# Patient Record
Sex: Male | Born: 1940 | Race: White | Hispanic: No | Smoking: Never smoker
Health system: Southern US, Community
[De-identification: ages and names within clinical notes are randomized; demographics above are authoritative.]

## PROBLEM LIST (undated history)

## (undated) DIAGNOSIS — I5020 Unspecified systolic (congestive) heart failure: Secondary | ICD-10-CM

## (undated) DIAGNOSIS — J449 Chronic obstructive pulmonary disease, unspecified: Secondary | ICD-10-CM

## (undated) DIAGNOSIS — D649 Anemia, unspecified: Secondary | ICD-10-CM

## (undated) DIAGNOSIS — N184 Chronic kidney disease, stage 4 (severe): Secondary | ICD-10-CM

## (undated) DIAGNOSIS — I10 Essential (primary) hypertension: Secondary | ICD-10-CM

## (undated) DIAGNOSIS — G20 Parkinson's disease: Secondary | ICD-10-CM

## (undated) DIAGNOSIS — G20A1 Parkinson's disease without dyskinesia, without mention of fluctuations: Secondary | ICD-10-CM

## (undated) DIAGNOSIS — I480 Paroxysmal atrial fibrillation: Secondary | ICD-10-CM

---

## 2017-04-24 ENCOUNTER — Encounter: Payer: Self-pay | Admitting: Emergency Medicine

## 2017-04-24 ENCOUNTER — Emergency Department
Admission: EM | Admit: 2017-04-24 | Discharge: 2017-04-24 | Disposition: A | Discharge status: Home / Self Care | Payer: Medicare Other | Attending: Emergency Medicine | Admitting: Emergency Medicine

## 2017-04-24 DIAGNOSIS — I10 Essential (primary) hypertension: Secondary | ICD-10-CM | POA: Insufficient documentation

## 2017-04-24 DIAGNOSIS — G20 Parkinson's disease: Secondary | ICD-10-CM | POA: Diagnosis not present

## 2017-04-24 DIAGNOSIS — Y999 Unspecified external cause status: Secondary | ICD-10-CM | POA: Insufficient documentation

## 2017-04-24 DIAGNOSIS — L089 Local infection of the skin and subcutaneous tissue, unspecified: Secondary | ICD-10-CM | POA: Diagnosis not present

## 2017-04-24 DIAGNOSIS — T148XXA Other injury of unspecified body region, initial encounter: Secondary | ICD-10-CM | POA: Insufficient documentation

## 2017-04-24 DIAGNOSIS — R2681 Unsteadiness on feet: Secondary | ICD-10-CM | POA: Diagnosis not present

## 2017-04-24 DIAGNOSIS — Y929 Unspecified place or not applicable: Secondary | ICD-10-CM | POA: Diagnosis not present

## 2017-04-24 DIAGNOSIS — W19XXXA Unspecified fall, initial encounter: Secondary | ICD-10-CM | POA: Insufficient documentation

## 2017-04-24 DIAGNOSIS — Y939 Activity, unspecified: Secondary | ICD-10-CM | POA: Insufficient documentation

## 2017-04-24 DIAGNOSIS — J449 Chronic obstructive pulmonary disease, unspecified: Secondary | ICD-10-CM | POA: Insufficient documentation

## 2017-04-24 DIAGNOSIS — Y92009 Unspecified place in unspecified non-institutional (private) residence as the place of occurrence of the external cause: Secondary | ICD-10-CM

## 2017-04-24 HISTORY — DX: Essential (primary) hypertension: I10

## 2017-04-24 HISTORY — DX: Chronic obstructive pulmonary disease, unspecified: J44.9

## 2017-04-24 HISTORY — DX: Parkinson's disease: G20

## 2017-04-24 HISTORY — DX: Parkinson's disease without dyskinesia, without mention of fluctuations: G20.A1

## 2017-04-24 MED ORDER — SULFAMETHOXAZOLE-TRIMETHOPRIM 800-160 MG PO TABS
1.0000 | ORAL_TABLET | Freq: Once | ORAL | Status: AC
  Administered 2017-04-24: 1 via ORAL
  Filled 2017-04-24: qty 1

## 2017-04-24 MED ORDER — SULFAMETHOXAZOLE-TRIMETHOPRIM 800-160 MG PO TABS: 1.0000 | ORAL_TABLET | Freq: Two times a day (BID) | ORAL | 0 refills | Status: DC

## 2017-04-24 MED ORDER — MUPIROCIN CALCIUM 2 % EX CREA
TOPICAL_CREAM | Freq: Once | CUTANEOUS | Status: AC
  Administered 2017-04-24: 18:00:00 via TOPICAL
  Filled 2017-04-24: qty 15

## 2017-04-24 NOTE — ED Triage Notes (Addendum)
FIRST NURSE NOTE-fell 4 days ago and hurt left arm.  Concerned may be infected. Scant purulent drainage noted.

## 2017-04-24 NOTE — ED Provider Notes (Signed)
[facility] Emergency Department Provider Note  ____________________________________________  Time seen: Approximately 5:35 PM  I have reviewed the triage vital signs and the nursing notes.   HISTORY  Chief Complaint Fall (4 days ago)    HPI Alex Daniel is a 76 y.o. male Who presents emergency department complaining of possible infected wound to the right forearm. Patient reports he has Parkinson's and is sometimes unsteady on his feet. Patient suffered a fall approximately 4 days ago and suffered a skin tear to the right forearm. Patient reports that over the last 2 daysthe wound has become erythematous, draining mild purulent material. Patient denies any increase in pain. Patient is here to ensure that wound is not infected. No other complaints at this time.   Past Medical History:  Diagnosis Date  . COPD (chronic obstructive pulmonary disease) ([place])   . Hypertension   . Parkinson disease ([place])   . Renal disorder     There are no active problems to display for this patient.   History reviewed. No pertinent surgical history.  Prior to Admission medications   Medication Sig Start Date End Date Taking? Authorizing Provider  sulfamethoxazole-trimethoprim (BACTRIM DS,SEPTRA DS) 800-160 MG tablet Take 1 tablet by mouth 2 (two) times daily. 04/24/17   Cuthriell, Charline Bills, PA-C    Allergies Patient has no known allergies.  History reviewed. No pertinent family history.  Social History Social History  Substance Use Topics  . Smoking status: Never Smoker  . Smokeless tobacco: Never Used  . Alcohol use Not on file     Review of Systems  Constitutional: No fever/chills Cardiovascular: no chest pain. Respiratory: no cough. No SOB. Gastrointestinal: No abdominal pain.  No nausea, no vomiting.   Musculoskeletal: Negative for musculoskeletal pain. Skin: positive for skin tear with possible infection Neurological: Negative for headaches, focal  weakness or numbness. 10-point ROS otherwise negative.  ____________________________________________   PHYSICAL EXAM:  VITAL SIGNS: ED Triage Vitals [04/24/17 1636]  Enc Vitals Group     BP 130/60     Pulse Rate 79     Resp 16     Temp 98.9 F (37.2 C)     Temp Source Oral     SpO2 95 %     Weight 270 lb (122.5 kg)     Height 5\' 11"  (1.803 m)     Head Circumference      Peak Flow      Pain Score      Pain Loc      Pain Edu?      Excl. in [place]?      Constitutional: Alert and oriented. Well appearing and in no acute distress. Eyes: Conjunctivae are normal. PERRL. EOMI. Head: Atraumatic. Neck: No stridor.    Cardiovascular: Normal rate, regular rhythm. Normal S1 and S2.  Good peripheral circulation. Respiratory: Normal respiratory effort without tachypnea or retractions. Lungs CTAB. Good air entry to the bases with no decreased or absent breath sounds. Musculoskeletal: Full range of motion to all extremities. No gross deformities appreciated. Neurologic:  Normal speech and language. No gross focal neurologic deficits are appreciated.  Skin:  Skin is warm, dry and intact. No rash noted.kin tears noted to right forearm. Area has mild surrounding erythema. Minimal purulent drainage is noted from skin tear. No underlying fluctuance or induration. Radial pulse intact affected extremity. Sensation intact all 5 digits right extremity. Psychiatric: Mood and affect are normal. Speech and behavior are normal. Patient exhibits appropriate insight and judgement.  ____________________________________________   LABS (all labs ordered are listed, but only abnormal results are displayed)  Labs Reviewed - No data to display ____________________________________________  EKG   ____________________________________________  RADIOLOGY   No results found.  ____________________________________________    PROCEDURES  Procedure(s) performed:    Procedures  The wound is  cleansed, bactroban topical antibiotic applied, and dressed. The patient is alerted to watch for any other signs of worsening infection (redness, pus, pain, increased swelling or fever) and call if such occurs. Home wound care instructions are provided. Tetanus vaccination status reviewed: tetanus re-vaccination not indicated.   Medications  sulfamethoxazole-trimethoprim (BACTRIM DS,SEPTRA DS) 800-160 MG per tablet 1 tablet (not administered)  mupirocin cream (BACTROBAN) 2 % (not administered)     ____________________________________________   INITIAL IMPRESSION / ASSESSMENT AND PLAN / ED COURSE  Pertinent labs & imaging results that were available during my care of the patient were reviewed by me and considered in my medical decision making (see chart for details).  Review of the [place] CSRS was performed in accordance of the [place] prior to dispensing any controlled drugs.     Patient's diagnosis is consistent with infected skin tear. Patient has skin tear to the right forearm from a fall 4 days prior. Area shows moderate signs of infection. This is cleansed, dressed in the emergency department.. Patient will be discharged home with prescriptions for Bactrim. Patient is to follow up with primary care as needed or otherwise directed. Patient is given ED precautions to return to the ED for any worsening or new symptoms.     ____________________________________________  FINAL CLINICAL IMPRESSION(S) / ED DIAGNOSES  Final diagnoses:  Fall in home, initial encounter  Infected skin tear      NEW MEDICATIONS STARTED DURING THIS VISIT:  New Prescriptions   SULFAMETHOXAZOLE-TRIMETHOPRIM (BACTRIM DS,SEPTRA DS) 800-160 MG TABLET    Take 1 tablet by mouth 2 (two) times daily.        This chart was dictated using voice recognition software/Dragon. Despite best efforts to proofread, errors can occur which can change the meaning. Any change was purely unintentional.    Darletta Moll, PA-C 04/24/17 1747    Earleen Newport, MD 04/24/17 [phone]

## 2017-10-26 ENCOUNTER — Emergency Department
Admission: EM | Admit: 2017-10-26 | Discharge: 2017-10-27 | Disposition: A | Discharge status: Home / Self Care | Payer: Medicare Other | Attending: Emergency Medicine | Admitting: Emergency Medicine

## 2017-10-26 ENCOUNTER — Other Ambulatory Visit: Payer: Self-pay

## 2017-10-26 ENCOUNTER — Emergency Department: Payer: Medicare Other

## 2017-10-26 DIAGNOSIS — G20 Parkinson's disease: Secondary | ICD-10-CM | POA: Insufficient documentation

## 2017-10-26 DIAGNOSIS — I10 Essential (primary) hypertension: Secondary | ICD-10-CM | POA: Diagnosis not present

## 2017-10-26 DIAGNOSIS — J441 Chronic obstructive pulmonary disease with (acute) exacerbation: Secondary | ICD-10-CM | POA: Diagnosis not present

## 2017-10-26 DIAGNOSIS — R0602 Shortness of breath: Secondary | ICD-10-CM | POA: Diagnosis present

## 2017-10-26 MED ORDER — ALBUTEROL SULFATE HFA 108 (90 BASE) MCG/ACT IN AERS: INHALATION_SPRAY | RESPIRATORY_TRACT | 1 refills | Status: AC

## 2017-10-26 MED ORDER — DEXAMETHASONE 6 MG PO TABS: 10.0000 mg | ORAL_TABLET | ORAL | Status: DC

## 2017-10-26 MED ORDER — ALBUTEROL SULFATE (2.5 MG/3ML) 0.083% IN NEBU
5.0000 mg | INHALATION_SOLUTION | Freq: Once | RESPIRATORY_TRACT | Status: AC
  Administered 2017-10-26: 5 mg via RESPIRATORY_TRACT
  Filled 2017-10-26: qty 6

## 2017-10-26 MED ORDER — DEXAMETHASONE 10 MG/ML FOR PEDIATRIC ORAL USE
10.0000 mg | Freq: Once | INTRAMUSCULAR | Status: AC
  Administered 2017-10-26: 10 mg via ORAL

## 2017-10-26 MED ORDER — DEXAMETHASONE SODIUM PHOSPHATE 10 MG/ML IJ SOLN
INTRAMUSCULAR | Status: AC
  Administered 2017-10-26: 10 mg via ORAL
  Filled 2017-10-26: qty 1

## 2017-10-26 NOTE — ED Notes (Signed)
Pt reports he has a Hx of COPD and in the last week it has gotten worse. Pt is alert and oriented x 4. Family at bedside.

## 2017-10-26 NOTE — ED Provider Notes (Signed)
[facility] Emergency Department Provider Note  ____________________________________________   First MD Initiated Contact with Patient 10/26/17 2314     (approximate)  I have reviewed the triage vital signs and the nursing notes.   HISTORY  Chief Complaint Shortness of Breath    HPI Alex Daniel is a 77 y.o. male with medical history as listed below which notably includes mild COPD with no oxygen requirement.  Of note, he also disputes the Parkinson disease diagnosis and states that he has been to 2 neurologists including one recently at [place] who states that he does NOT have Parkinson's, and he is currently working with a provider to wean him off of all his "unnecessary" medications.  He presents today for evaluation of gradually worsening shortness of breath over the last week.  He says that he is affected by the pollen that has been going on so heavily recently and has tried to avoid going outside.  He reports that his shortness of breath is worse with exertion and better with rest.  He has had a mild and occasionally productive cough.  He denies fever/chills, chest pain, nausea, vomiting, and abdominal pain.  He does have inhalers at home and has been using them but only uses 1 or 2 puffs a time and it does not seem to be helping.  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  He does not have a pulmonologist.  He received a DuoNeb upon arrival in the emergency department and states that he feels much better and is ready to go home.  Past Medical History:  Diagnosis Date  . COPD (chronic obstructive pulmonary disease) ([place])   . Hypertension   . Parkinson disease ([place])   . Renal disorder     There are no active problems to display for this patient.   History reviewed. No pertinent surgical history.  Prior to Admission medications   Medication Sig Start Date End Date Taking?  Authorizing Provider  albuterol (PROVENTIL HFA;VENTOLIN HFA) 108 (90 Base) MCG/ACT inhaler Inhale 2-4 puffs by mouth every 4 hours as needed for wheezing, cough, and/or shortness of breath 10/26/17   Hinda Kehr, MD  sulfamethoxazole-trimethoprim (BACTRIM DS,SEPTRA DS) 800-160 MG tablet Take 1 tablet by mouth 2 (two) times daily. 04/24/17   Cuthriell, Charline Bills, PA-C    Allergies Patient has no known allergies.  No family history on file.  Social History Social History   Tobacco Use  . Smoking status: Never Smoker  . Smokeless tobacco: Never Used  Substance Use Topics  . Alcohol use: Not on file  . Drug use: Not on file    Review of Systems Constitutional: No fever/chills Eyes: No visual changes. ENT: No sore throat. Cardiovascular: Denies chest pain. Respiratory: Gradually worsening shortness of breath for about a week as described above Gastrointestinal: No abdominal pain.  No nausea, no vomiting.  No diarrhea.  No constipation. Genitourinary: Negative for dysuria. Musculoskeletal: Negative for neck pain.  Negative for back pain. Integumentary: Negative for rash. Neurological: Negative for headaches, focal weakness or numbness.   ____________________________________________   PHYSICAL EXAM:  VITAL SIGNS: ED Triage Vitals  Enc Vitals Group     BP 10/26/17 2235 (!) 172/85     Pulse Rate 10/26/17 2235 85     Resp 10/26/17 2235 18     Temp 10/26/17 2235 98.3 F (36.8 C)     Temp Source 10/26/17 2235 Oral  SpO2 10/26/17 2235 93 %     Weight 10/26/17 2233 127 kg (280 lb)     Height 10/26/17 2233 1.829 m (6')     Head Circumference --      Peak Flow --      Pain Score 10/26/17 2232 5     Pain Loc --      Pain Edu? --      Excl. in [place]? --     Constitutional: Alert and oriented. Well appearing and in no acute distress. Eyes: Conjunctivae are normal.  Head: Atraumatic. Nose: No congestion/rhinnorhea. Mouth/Throat: Mucous membranes are moist. Neck: No  stridor.  No meningeal signs.   Cardiovascular: Normal rate, regular rhythm. Good peripheral circulation. Grossly normal heart sounds. Respiratory: Normal respiratory effort.  No retractions.  Minor expiratory wheezing more notable in the left lower lobe than the right, but still minimal. Gastrointestinal: Soft and nontender. No distention. Musculoskeletal: No lower extremity tenderness nor edema. No gross deformities of extremities. Neurologic:  Normal speech and language. No gross focal neurologic deficits are appreciated.  Skin:  Skin is warm, dry and intact. No rash noted. Psychiatric: Mood and affect are normal. Speech and behavior are normal.  ____________________________________________   LABS (all labs ordered are listed, but only abnormal results are displayed)  Labs Reviewed - No data to display ____________________________________________  EKG  Patient declined EKG ____________________________________________  RADIOLOGY I, Hinda Kehr, personally viewed and evaluated these images (plain radiographs) as part of my medical decision making, as well as reviewing the written report by the radiologist.  ED MD interpretation: No evidence of pneumonia or other acute pulmonary process  Official radiology report(s): Dg Chest 2 View  Result Date: 10/26/2017 CLINICAL DATA:  Worsening short of breath EXAM: CHEST - 2 VIEW COMPARISON:  None. FINDINGS: Normal cardiac silhouette. No effusion, infiltrate or pneumothorax. Chronic bronchitic markings. Low lung volumes. IMPRESSION: No acute cardiopulmonary process. Electronically Signed   By: Suzy Bouchard M.D.   On: 10/26/2017 22:59    ____________________________________________   PROCEDURES  Critical Care performed: No   Procedure(s) performed:   Procedures   ____________________________________________   INITIAL IMPRESSION / ASSESSMENT AND PLAN / ED COURSE  As part of my medical decision making, I reviewed the  following data within the [place] History obtained from family, Nursing notes reviewed and incorporated, Old chart reviewed, Radiograph reviewed  and Notes from prior ED visits    Differential includes, but is not limited to, viral syndrome, bronchitis including COPD exacerbation, pneumonia, reactive airway disease including asthma, CHF including exacerbation with or without pulmonary/interstitial edema, pneumothorax, ACS, thoracic trauma, and pulmonary embolism.  However, the patient has a history of mild COPD as well as some seasonal/pollen allergies, his symptoms have been gradual in onset over the last week, he has no evidence of peripheral edema and no history of cardiac disease or CHF.  He received a breathing treatment prior to me coming in the room and states that it made him feel much better and he is ready to go.  I discussed a "standard workup" which would include lab work but I did acknowledge that lab work is unlikely to be particularly helpful in his case.  He assured me multiple times he has no chest pain.  I explained to him that he could be having shortness of breath due to a heart problem and that I would encourage him to get an EKG at least, but he says he just recently saw his cardiologist and "  had all that done" and he does not want an EKG tonight and just wants to go home.  I suggested that we give him a dose of Decadron here in the emergency department rather than starting him on Prednisone (given his age and recent attempts to decrease medications), and he agrees.  I gave the patient and his son-in-law my usual/customary return precautions and encouraged him to follow up as soon as possible with his regular doctor and to return if he gets worse.  They understand and agree with the plan.     ____________________________________________  FINAL CLINICAL IMPRESSION(S) / ED DIAGNOSES  Final diagnoses:  COPD exacerbation ([place])     MEDICATIONS GIVEN DURING THIS  VISIT:  Medications  albuterol (PROVENTIL) (2.5 MG/3ML) 0.083% nebulizer solution 5 mg (5 mg Nebulization Given 10/26/17 2239)  dexamethasone (DECADRON) 10 MG/ML injection for Pediatric ORAL use 10 mg (10 mg Oral Given 10/26/17 2339)     ED Discharge Orders        Ordered    albuterol (PROVENTIL HFA;VENTOLIN HFA) 108 (90 Base) MCG/ACT inhaler     10/26/17 2347       Note:  This document was prepared using Dragon voice recognition software and may include unintentional dictation errors.    Hinda Kehr, MD 10/26/17 [phone]

## 2017-10-26 NOTE — Discharge Instructions (Signed)
We believe that your symptoms are caused today by an exacerbation of your COPD.  Please take your regular medications that you have at home for your COPD.  Feel free to use your albuterol inhaler as much as 2-4 puffs everyFollow up with your doctor as recommended.  If you develop any new or worsening symptoms, including but not limited to fever, persistent vomiting, worsening shortness of breath, or other symptoms that concern you, please return to the Emergency Department immediately.

## 2017-10-26 NOTE — ED Triage Notes (Signed)
Pt arrives to ED via POV from home with c/o worsening SHOB x1 week. No c/o N/V/D or fever, no c/o CP or abdominal pain. Pt reports he used to work with [employer] and states his respiratory issues are r/t huis previous work history. Pt reports ocasional productive cough (white sputum). Pt is A&O, in NAD; RR even, regular, and unlabored; skin color/temp is WNL.

## 2018-08-27 ENCOUNTER — Inpatient Hospital Stay
Admission: EM | Admit: 2018-08-27 | Discharge: 2018-09-03 | DRG: 291 | Disposition: A | Discharge status: Home / Self Care | Payer: Medicare Other | Attending: Internal Medicine | Admitting: Internal Medicine

## 2018-08-27 ENCOUNTER — Emergency Department: Payer: Medicare Other

## 2018-08-27 ENCOUNTER — Encounter: Payer: Self-pay | Admitting: Emergency Medicine

## 2018-08-27 DIAGNOSIS — J449 Chronic obstructive pulmonary disease, unspecified: Secondary | ICD-10-CM | POA: Diagnosis present

## 2018-08-27 DIAGNOSIS — E871 Hypo-osmolality and hyponatremia: Secondary | ICD-10-CM | POA: Diagnosis present

## 2018-08-27 DIAGNOSIS — E875 Hyperkalemia: Secondary | ICD-10-CM | POA: Diagnosis present

## 2018-08-27 DIAGNOSIS — N189 Chronic kidney disease, unspecified: Secondary | ICD-10-CM | POA: Diagnosis not present

## 2018-08-27 DIAGNOSIS — D631 Anemia in chronic kidney disease: Secondary | ICD-10-CM | POA: Diagnosis present

## 2018-08-27 DIAGNOSIS — I4819 Other persistent atrial fibrillation: Secondary | ICD-10-CM | POA: Diagnosis present

## 2018-08-27 DIAGNOSIS — E878 Other disorders of electrolyte and fluid balance, not elsewhere classified: Secondary | ICD-10-CM | POA: Diagnosis present

## 2018-08-27 DIAGNOSIS — J9611 Chronic respiratory failure with hypoxia: Secondary | ICD-10-CM | POA: Diagnosis present

## 2018-08-27 DIAGNOSIS — Z6841 Body Mass Index (BMI) 40.0 and over, adult: Secondary | ICD-10-CM | POA: Diagnosis not present

## 2018-08-27 DIAGNOSIS — G20 Parkinson's disease: Secondary | ICD-10-CM | POA: Diagnosis present

## 2018-08-27 DIAGNOSIS — R531 Weakness: Secondary | ICD-10-CM

## 2018-08-27 DIAGNOSIS — F329 Major depressive disorder, single episode, unspecified: Secondary | ICD-10-CM | POA: Diagnosis present

## 2018-08-27 DIAGNOSIS — Z9981 Dependence on supplemental oxygen: Secondary | ICD-10-CM | POA: Diagnosis not present

## 2018-08-27 DIAGNOSIS — E861 Hypovolemia: Secondary | ICD-10-CM | POA: Diagnosis present

## 2018-08-27 DIAGNOSIS — D649 Anemia, unspecified: Secondary | ICD-10-CM | POA: Diagnosis not present

## 2018-08-27 DIAGNOSIS — R609 Edema, unspecified: Secondary | ICD-10-CM

## 2018-08-27 DIAGNOSIS — N179 Acute kidney failure, unspecified: Secondary | ICD-10-CM | POA: Diagnosis not present

## 2018-08-27 DIAGNOSIS — I361 Nonrheumatic tricuspid (valve) insufficiency: Secondary | ICD-10-CM | POA: Diagnosis not present

## 2018-08-27 DIAGNOSIS — I5043 Acute on chronic combined systolic (congestive) and diastolic (congestive) heart failure: Secondary | ICD-10-CM | POA: Diagnosis present

## 2018-08-27 DIAGNOSIS — I5023 Acute on chronic systolic (congestive) heart failure: Secondary | ICD-10-CM | POA: Diagnosis not present

## 2018-08-27 DIAGNOSIS — R0602 Shortness of breath: Secondary | ICD-10-CM | POA: Diagnosis not present

## 2018-08-27 DIAGNOSIS — N184 Chronic kidney disease, stage 4 (severe): Secondary | ICD-10-CM | POA: Diagnosis present

## 2018-08-27 DIAGNOSIS — I5022 Chronic systolic (congestive) heart failure: Secondary | ICD-10-CM | POA: Diagnosis not present

## 2018-08-27 DIAGNOSIS — I420 Dilated cardiomyopathy: Secondary | ICD-10-CM | POA: Diagnosis not present

## 2018-08-27 DIAGNOSIS — E860 Dehydration: Secondary | ICD-10-CM | POA: Diagnosis not present

## 2018-08-27 DIAGNOSIS — Z7901 Long term (current) use of anticoagulants: Secondary | ICD-10-CM

## 2018-08-27 DIAGNOSIS — R55 Syncope and collapse: Secondary | ICD-10-CM

## 2018-08-27 DIAGNOSIS — I130 Hypertensive heart and chronic kidney disease with heart failure and stage 1 through stage 4 chronic kidney disease, or unspecified chronic kidney disease: Principal | ICD-10-CM | POA: Diagnosis present

## 2018-08-27 HISTORY — DX: Chronic kidney disease, stage 4 (severe): N18.4

## 2018-08-27 HISTORY — DX: Paroxysmal atrial fibrillation: I48.0

## 2018-08-27 HISTORY — DX: Unspecified systolic (congestive) heart failure: I50.20

## 2018-08-27 HISTORY — DX: Anemia, unspecified: D64.9

## 2018-08-27 HISTORY — DX: Morbid (severe) obesity due to excess calories: E66.01

## 2018-08-27 LAB — CBC
HEMATOCRIT: 28.8 % — AB (ref 39.0–52.0)
Hemoglobin: 9 g/dL — ABNORMAL LOW (ref 13.0–17.0)
MCH: 29.9 pg (ref 26.0–34.0)
MCHC: 31.3 g/dL (ref 30.0–36.0)
MCV: 95.7 fL (ref 80.0–100.0)
NRBC: 0 % (ref 0.0–0.2)
Platelets: 287 10*3/uL (ref 150–400)
RBC: 3.01 MIL/uL — ABNORMAL LOW (ref 4.22–5.81)
RDW: 14.4 % (ref 11.5–15.5)
WBC: 12.4 10*3/uL — AB (ref 4.0–10.5)

## 2018-08-27 LAB — BASIC METABOLIC PANEL
Anion gap: 8 (ref 5–15)
BUN: 66 mg/dL — ABNORMAL HIGH (ref 8–23)
CO2: 31 mmol/L (ref 22–32)
CREATININE: 2.44 mg/dL — AB (ref 0.61–1.24)
Calcium: 8.4 mg/dL — ABNORMAL LOW (ref 8.9–10.3)
Chloride: 93 mmol/L — ABNORMAL LOW (ref 98–111)
GFR, EST AFRICAN AMERICAN: 29 mL/min — AB (ref >60)
GFR, EST NON AFRICAN AMERICAN: 25 mL/min — AB (ref >60)
Glucose, Bld: 120 mg/dL — ABNORMAL HIGH (ref 70–99)
POTASSIUM: 5 mmol/L (ref 3.5–5.1)
SODIUM: 132 mmol/L — AB (ref 135–145)

## 2018-08-27 LAB — PHOSPHORUS: Phosphorus: 3.9 mg/dL (ref 2.5–4.6)

## 2018-08-27 LAB — BRAIN NATRIURETIC PEPTIDE: B NATRIURETIC PEPTIDE 5: 228 pg/mL — AB (ref 0.0–100.0)

## 2018-08-27 LAB — HEPATIC FUNCTION PANEL
ALT: 30 U/L (ref 0–44)
AST: 30 U/L (ref 15–41)
Albumin: 3.2 g/dL — ABNORMAL LOW (ref 3.5–5.0)
Alkaline Phosphatase: 54 U/L (ref 38–126)
BILIRUBIN TOTAL: 0.4 mg/dL (ref 0.3–1.2)
Bilirubin, Direct: 0.1 mg/dL (ref 0.0–0.2)
Indirect Bilirubin: 0.3 mg/dL (ref 0.3–0.9)
Total Protein: 7.4 g/dL (ref 6.5–8.1)

## 2018-08-27 LAB — TROPONIN I: TROPONIN I: 0.07 ng/mL — AB (ref <0.03)

## 2018-08-27 LAB — MAGNESIUM: Magnesium: 2.6 mg/dL — ABNORMAL HIGH (ref 1.7–2.4)

## 2018-08-27 MED ORDER — ALBUMIN HUMAN 25 % IV SOLN
12.5000 g | Freq: Once | INTRAVENOUS | Status: AC
  Administered 2018-08-27: 12.5 g via INTRAVENOUS
  Filled 2018-08-27: qty 50

## 2018-08-27 MED ORDER — ENSURE ENLIVE PO LIQD
237.0000 mL | Freq: Two times a day (BID) | ORAL | Status: DC
  Administered 2018-08-28 – 2018-09-01 (×6): 237 mL via ORAL

## 2018-08-27 MED ORDER — ASPIRIN 81 MG PO CHEW
81.0000 mg | CHEWABLE_TABLET | Freq: Every day | ORAL | Status: DC
  Administered 2018-08-28 – 2018-08-30 (×3): 81 mg via ORAL
  Filled 2018-08-27 (×3): qty 1

## 2018-08-27 MED ORDER — FUROSEMIDE 10 MG/ML IJ SOLN
20.0000 mg | Freq: Once | INTRAMUSCULAR | Status: AC
  Administered 2018-08-28: 20 mg via INTRAVENOUS
  Filled 2018-08-27: qty 4

## 2018-08-27 MED ORDER — SODIUM CHLORIDE 0.9 % IV BOLUS
1000.0000 mL | Freq: Once | INTRAVENOUS | Status: AC
  Administered 2018-08-27: 1000 mL via INTRAVENOUS

## 2018-08-27 MED ORDER — SODIUM CHLORIDE 0.9% FLUSH: 3.0000 mL | Freq: Once | INTRAVENOUS | Status: DC

## 2018-08-27 NOTE — ED Provider Notes (Signed)
[facility] Emergency Department Provider Note    First MD Initiated Contact with Patient 08/27/18 2003     (approximate)  I have reviewed the triage vital signs and the nursing notes.   HISTORY  Chief Complaint Fall and Weakness    HPI Alex Daniel is a 78 y.o. male to history of COPD as well as of heart failure presents to the ER for near syncopal event associated with exertional dyspnea.  Patient reportedly became very weak when walking to the bathroom and tried to sit down on his rolling walker.  His daughter was with him and helped him to the floor as he appeared unresponsive.  Did not hit his head.  Denies any chest pain but does feel short of breath.  Does wear 2 L nasal cannula chronically.  States he is had over 15 to 20 pound weight loss over the past several days and has been heavily diuresed on his home medications they recently increased his Lasix.    Past Medical History:  Diagnosis Date  . COPD (chronic obstructive pulmonary disease) ([place])   . Hypertension   . Parkinson disease ([place])   . Renal disorder    History reviewed. No pertinent family history. History reviewed. No pertinent surgical history. Patient Active Problem List   Diagnosis Date Noted  . SOB (shortness of breath) 08/27/2018      Prior to Admission medications   Medication Sig Start Date End Date Taking? Authorizing Provider  albuterol (PROVENTIL HFA;VENTOLIN HFA) 108 (90 Base) MCG/ACT inhaler Inhale 2-4 puffs by mouth every 4 hours as needed for wheezing, cough, and/or shortness of breath 10/26/17  Yes Hinda Kehr, MD  buPROPion (WELLBUTRIN XL) 300 MG 24 hr tablet Take 300 mg by mouth daily. 08/26/18  Yes [provider]  carbamazepine (TEGRETOL XR) 100 MG 12 hr tablet Take 100 mg by mouth 2 (two) times daily. 08/14/18  Yes [provider]  clonazePAM (KLONOPIN) 0.5 MG tablet Take 0.5 mg by mouth 2 (two) times daily. 08/21/18  Yes [provider]    ELIQUIS 5 MG TABS tablet Take 5 mg by mouth 2 (two) times daily. 08/26/18  Yes [provider]  furosemide (LASIX) 40 MG tablet Take 40 mg by mouth 2 (two) times daily. 08/26/18  Yes [provider]  lisinopril (PRINIVIL,ZESTRIL) 20 MG tablet Take 20 mg by mouth 2 (two) times daily. 08/14/18  Yes [provider]  metoprolol succinate (TOPROL-XL) 50 MG 24 hr tablet Take 50 mg by mouth daily. 08/26/18  Yes [provider]  montelukast (SINGULAIR) 10 MG tablet Take 10 mg by mouth every evening. 08/09/18  Yes [provider]  tamsulosin (FLOMAX) 0.4 MG CAPS capsule Take 0.4 mg by mouth daily. 08/26/18  Yes [provider]  VIIBRYD 40 MG TABS Take 40 mg by mouth daily. 07/29/18  Yes [provider]    Allergies Patient has no known allergies.    Social History Social History   Tobacco Use  . Smoking status: Never Smoker  . Smokeless tobacco: Never Used  Substance Use Topics  . Alcohol use: Not Currently    Frequency: Never  . Drug use: Never    Review of Systems Patient denies headaches, rhinorrhea, blurry vision, numbness, shortness of breath, chest pain, edema, cough, abdominal pain, nausea, vomiting, diarrhea, dysuria, fevers, rashes or hallucinations unless otherwise stated above in HPI. ____________________________________________   PHYSICAL EXAM:  VITAL SIGNS: Vitals:   08/27/18 2324 08/27/18 2330  BP: Marland Kitchen)  141/56 130/64  Pulse: 92 (!) 166  Resp: (!) 22 19  Temp:    SpO2: 99% 96%    Constitutional: Alert and oriented.  Eyes: Conjunctivae are normal.  Head: Atraumatic. Nose: No congestion/rhinnorhea. Mouth/Throat: Mucous membranes are moist.   Neck: No stridor. Painless ROM.  Cardiovascular: Normal rate, regular rhythm. Grossly normal heart sounds.  Good peripheral circulation. Respiratory: mild tachypnea with diminished bibasilar breathsounds Gastrointestinal: Soft and nontender. No distention. No abdominal  bruits. No CVA tenderness. Genitourinary:  Musculoskeletal: No lower extremity tenderness, 2+ BLE edema No joint effusions. Neurologic:  Normal speech and language. No gross focal neurologic deficits are appreciated. No facial droop Skin:  Skin is warm, dry and intact. No rash noted. Psychiatric: Mood and affect are normal. Speech and behavior are normal.  ____________________________________________   LABS (all labs ordered are listed, but only abnormal results are displayed)  Results for orders placed or performed during the hospital encounter of 08/27/18 (from the past 24 hour(s))  Basic metabolic panel     Status: Abnormal   Collection Time: 08/27/18  8:15 PM  Result Value Ref Range   Sodium 132 (L) 135 - 145 mmol/L   Potassium 5.0 3.5 - 5.1 mmol/L   Chloride 93 (L) 98 - 111 mmol/L   CO2 31 22 - 32 mmol/L   Glucose, Bld 120 (H) 70 - 99 mg/dL   BUN 66 (H) 8 - 23 mg/dL   Creatinine, Ser 2.44 (H) 0.61 - 1.24 mg/dL   Calcium 8.4 (L) 8.9 - 10.3 mg/dL   GFR calc non Af Amer 25 (L) >60 mL/min   GFR calc Af Amer 29 (L) >60 mL/min   Anion gap 8 5 - 15  CBC     Status: Abnormal   Collection Time: 08/27/18  8:15 PM  Result Value Ref Range   WBC 12.4 (H) 4.0 - 10.5 K/uL   RBC 3.01 (L) 4.22 - 5.81 MIL/uL   Hemoglobin 9.0 (L) 13.0 - 17.0 g/dL   HCT 28.8 (L) 39.0 - 52.0 %   MCV 95.7 80.0 - 100.0 fL   MCH 29.9 26.0 - 34.0 pg   MCHC 31.3 30.0 - 36.0 g/dL   RDW 14.4 11.5 - 15.5 %   Platelets 287 150 - 400 K/uL   nRBC 0.0 0.0 - 0.2 %  Troponin I - ONCE - STAT     Status: Abnormal   Collection Time: 08/27/18  8:15 PM  Result Value Ref Range   Troponin I 0.07 (HH) <0.03 ng/mL  Brain natriuretic peptide     Status: Abnormal   Collection Time: 08/27/18  8:15 PM  Result Value Ref Range   B Natriuretic Peptide 228.0 (H) 0.0 - 100.0 pg/mL  Hepatic function panel     Status: Abnormal   Collection Time: 08/27/18  8:15 PM  Result Value Ref Range   Total Protein 7.4 6.5 - 8.1 g/dL   Albumin  3.2 (L) 3.5 - 5.0 g/dL   AST 30 15 - 41 U/L   ALT 30 0 - 44 U/L   Alkaline Phosphatase 54 38 - 126 U/L   Total Bilirubin 0.4 0.3 - 1.2 mg/dL   Bilirubin, Direct 0.1 0.0 - 0.2 mg/dL   Indirect Bilirubin 0.3 0.3 - 0.9 mg/dL  Magnesium     Status: Abnormal   Collection Time: 08/27/18  8:15 PM  Result Value Ref Range   Magnesium 2.6 (H) 1.7 - 2.4 mg/dL  Phosphorus     Status: None  Collection Time: 08/27/18  8:15 PM  Result Value Ref Range   Phosphorus 3.9 2.5 - 4.6 mg/dL   ____________________________________________  EKG My review and personal interpretation at Time: 20:05     Indication: syncope  Rate: 85  Rhythm: afib Axis: normal Other: no stemi, nonspecific st abn ____________________________________________  RADIOLOGY  I personally reviewed all radiographic images ordered to evaluate for the above acute complaints and reviewed radiology reports and findings.  These findings were personally discussed with the patient.  Please see medical record for radiology report.  ____________________________________________   PROCEDURES  Procedure(s) performed:  .Critical Care Performed by: Merlyn Lot, MD Authorized by: Merlyn Lot, MD   Critical care provider statement:    Critical care time (minutes):  30   Critical care was necessary to treat or prevent imminent or life-threatening deterioration of the following conditions:  Dehydration   Critical care was time spent personally by me on the following activities:  Discussions with consultants, evaluation of patient's response to treatment, examination of patient, ordering and performing treatments and interventions, ordering and review of laboratory studies, ordering and review of radiographic studies, pulse oximetry, re-evaluation of patient's condition, obtaining history from patient or surrogate and review of old charts      Critical Care performed: yes ____________________________________________   INITIAL  IMPRESSION / ASSESSMENT AND PLAN / ED COURSE  Pertinent labs & imaging results that were available during my care of the patient were reviewed by me and considered in my medical decision making (see chart for details).   DDX: CHF, anemia, AKI, dehydration, dysrhythmia  Dwon Sky is a 78 y.o. who presents to the ED with symptoms as described above.  Patient arrives with symptoms concerning for dehydration versus congestive heart failure.  Possible anasarca.  Patient with extensive past medical history contributing to presentation but with limited documentation in our chart.  Initial EKG does show evidence of rate controlled A. fib with nonspecific changes.  He does not have any new hypoxia in excess of his chronic.  Clinically he is quite edematous however and almost seems volume depleted therefore will give gentle IV hydration as well as a dose of albumin.  Based on his presentation I do believe the patient will require hospitalization for further medical management.      As part of my medical decision making, I reviewed the following data within the [place] notes reviewed and incorporated, Labs reviewed, notes from prior ED visits and [place] Controlled Substance Database   ____________________________________________   FINAL CLINICAL IMPRESSION(S) / ED DIAGNOSES  Final diagnoses:  Weakness  Near syncope  Dehydration      NEW MEDICATIONS STARTED DURING THIS VISIT:  New Prescriptions   No medications on file     Note:  This document was prepared using Dragon voice recognition software and may include unintentional dictation errors.    Merlyn Lot, MD 08/28/18 [phone]

## 2018-08-27 NOTE — ED Triage Notes (Signed)
Pt to ED by EMS. Per daughter pt became SOB and fell while walking to bathroom. Pt has hx of CHF and had recent Lasix dose change from 40 to 80 and has lost 20 lbs in 5 days. EMS states daughter believes he may have had a seizure she noted shaking and per EMS pt was incontinent.

## 2018-08-28 ENCOUNTER — Other Ambulatory Visit: Payer: Self-pay

## 2018-08-28 ENCOUNTER — Inpatient Hospital Stay: Payer: Medicare Other

## 2018-08-28 ENCOUNTER — Inpatient Hospital Stay (HOSPITAL_COMMUNITY)
Admit: 2018-08-28 | Discharge: 2018-08-28 | Disposition: A | Discharge status: Home / Self Care | Payer: Medicare Other | Attending: Internal Medicine | Admitting: Internal Medicine

## 2018-08-28 DIAGNOSIS — R0602 Shortness of breath: Secondary | ICD-10-CM

## 2018-08-28 DIAGNOSIS — I361 Nonrheumatic tricuspid (valve) insufficiency: Secondary | ICD-10-CM

## 2018-08-28 LAB — URINALYSIS, COMPLETE (UACMP) WITH MICROSCOPIC
Bacteria, UA: NONE SEEN
Bilirubin Urine: NEGATIVE
Glucose, UA: NEGATIVE mg/dL
Ketones, ur: NEGATIVE mg/dL
Nitrite: NEGATIVE
PH: 5 (ref 5.0–8.0)
Protein, ur: NEGATIVE mg/dL
Specific Gravity, Urine: 1.006 (ref 1.005–1.030)

## 2018-08-28 LAB — PROTEIN, URINE, RANDOM: Total Protein, Urine: 7 mg/dL

## 2018-08-28 LAB — BASIC METABOLIC PANEL
Anion gap: 8 (ref 5–15)
BUN: 64 mg/dL — ABNORMAL HIGH (ref 8–23)
CO2: 30 mmol/L (ref 22–32)
Calcium: 8.5 mg/dL — ABNORMAL LOW (ref 8.9–10.3)
Chloride: 98 mmol/L (ref 98–111)
Creatinine, Ser: 2.3 mg/dL — ABNORMAL HIGH (ref 0.61–1.24)
GFR calc Af Amer: 31 mL/min — ABNORMAL LOW (ref >60)
GFR, EST NON AFRICAN AMERICAN: 27 mL/min — AB (ref >60)
Glucose, Bld: 110 mg/dL — ABNORMAL HIGH (ref 70–99)
Potassium: 5 mmol/L (ref 3.5–5.1)
Sodium: 136 mmol/L (ref 135–145)

## 2018-08-28 LAB — PREALBUMIN: Prealbumin: 16.1 mg/dL — ABNORMAL LOW (ref 18–38)

## 2018-08-28 LAB — NA AND K (SODIUM & POTASSIUM), RAND UR
Potassium Urine: 13 mmol/L
Sodium, Ur: 41 mmol/L

## 2018-08-28 LAB — TROPONIN I
Troponin I: 0.06 ng/mL (ref <0.03)
Troponin I: 0.06 ng/mL (ref <0.03)

## 2018-08-28 LAB — CREATININE, URINE, RANDOM: CREATININE, URINE: 39 mg/dL

## 2018-08-28 MED ORDER — ALBUTEROL SULFATE (2.5 MG/3ML) 0.083% IN NEBU
2.5000 mg | INHALATION_SOLUTION | Freq: Four times a day (QID) | RESPIRATORY_TRACT | Status: DC | PRN
  Administered 2018-08-28 – 2018-09-03 (×4): 2.5 mg via RESPIRATORY_TRACT
  Filled 2018-08-28 (×4): qty 3

## 2018-08-28 MED ORDER — VILAZODONE HCL 40 MG PO TABS
40.0000 mg | ORAL_TABLET | Freq: Every day | ORAL | Status: DC
  Administered 2018-08-28 – 2018-09-03 (×7): 40 mg via ORAL
  Filled 2018-08-28 (×9): qty 1

## 2018-08-28 MED ORDER — CLONAZEPAM 0.5 MG PO TABS
0.5000 mg | ORAL_TABLET | Freq: Every day | ORAL | Status: DC
  Administered 2018-08-29 – 2018-09-02 (×5): 0.5 mg via ORAL
  Filled 2018-08-28 (×5): qty 1

## 2018-08-28 MED ORDER — CLONAZEPAM 0.5 MG PO TABS
0.5000 mg | ORAL_TABLET | Freq: Two times a day (BID) | ORAL | Status: DC
  Administered 2018-08-28 (×2): 0.5 mg via ORAL
  Filled 2018-08-28 (×2): qty 1

## 2018-08-28 MED ORDER — ONDANSETRON HCL 4 MG/2ML IJ SOLN
4.0000 mg | Freq: Four times a day (QID) | INTRAMUSCULAR | Status: DC | PRN
  Administered 2018-08-28 – 2018-09-01 (×3): 4 mg via INTRAVENOUS
  Filled 2018-08-28 (×2): qty 2

## 2018-08-28 MED ORDER — ONDANSETRON HCL 4 MG/2ML IJ SOLN
4.0000 mg | Freq: Once | INTRAMUSCULAR | Status: DC
  Filled 2018-08-28: qty 2

## 2018-08-28 MED ORDER — BISACODYL 5 MG PO TBEC
5.0000 mg | DELAYED_RELEASE_TABLET | Freq: Every day | ORAL | Status: DC | PRN
  Administered 2018-09-01 – 2018-09-02 (×2): 5 mg via ORAL
  Filled 2018-08-28 (×2): qty 1

## 2018-08-28 MED ORDER — ACETAMINOPHEN 325 MG PO TABS
650.0000 mg | ORAL_TABLET | Freq: Four times a day (QID) | ORAL | Status: DC | PRN
  Administered 2018-08-28 – 2018-08-30 (×2): 650 mg via ORAL
  Filled 2018-08-28 (×2): qty 2

## 2018-08-28 MED ORDER — MONTELUKAST SODIUM 10 MG PO TABS
10.0000 mg | ORAL_TABLET | Freq: Every evening | ORAL | Status: DC
  Administered 2018-08-28 – 2018-09-02 (×6): 10 mg via ORAL
  Filled 2018-08-28 (×6): qty 1

## 2018-08-28 MED ORDER — SENNOSIDES-DOCUSATE SODIUM 8.6-50 MG PO TABS
1.0000 | ORAL_TABLET | Freq: Every evening | ORAL | Status: DC | PRN
  Administered 2018-09-01: 1 via ORAL
  Filled 2018-08-28: qty 1

## 2018-08-28 MED ORDER — METOPROLOL SUCCINATE ER 50 MG PO TB24
50.0000 mg | ORAL_TABLET | Freq: Every day | ORAL | Status: DC
  Administered 2018-08-28 – 2018-09-03 (×7): 50 mg via ORAL
  Filled 2018-08-28 (×7): qty 1

## 2018-08-28 MED ORDER — ADULT MULTIVITAMIN W/MINERALS CH
1.0000 | ORAL_TABLET | Freq: Every day | ORAL | Status: DC
  Administered 2018-08-28 – 2018-09-03 (×7): 1 via ORAL
  Filled 2018-08-28 (×7): qty 1

## 2018-08-28 MED ORDER — TAMSULOSIN HCL 0.4 MG PO CAPS
0.4000 mg | ORAL_CAPSULE | Freq: Every day | ORAL | Status: DC
  Administered 2018-08-28 – 2018-09-03 (×7): 0.4 mg via ORAL
  Filled 2018-08-28 (×7): qty 1

## 2018-08-28 MED ORDER — ACETAMINOPHEN 650 MG RE SUPP: 650.0000 mg | Freq: Four times a day (QID) | RECTAL | Status: DC | PRN

## 2018-08-28 MED ORDER — BUPROPION HCL ER (XL) 150 MG PO TB24
300.0000 mg | ORAL_TABLET | Freq: Every day | ORAL | Status: DC
  Administered 2018-08-28 – 2018-09-03 (×7): 300 mg via ORAL
  Filled 2018-08-28 (×7): qty 2

## 2018-08-28 MED ORDER — APIXABAN 5 MG PO TABS
5.0000 mg | ORAL_TABLET | Freq: Two times a day (BID) | ORAL | Status: DC
  Administered 2018-08-28 – 2018-09-03 (×14): 5 mg via ORAL
  Filled 2018-08-28 (×14): qty 1

## 2018-08-28 MED ORDER — ORAL CARE MOUTH RINSE
15.0000 mL | Freq: Two times a day (BID) | OROMUCOSAL | Status: DC
  Administered 2018-08-28 – 2018-09-03 (×5): 15 mL via OROMUCOSAL

## 2018-08-28 MED ORDER — PERFLUTREN LIPID MICROSPHERE
1.0000 mL | INTRAVENOUS | Status: AC | PRN
  Administered 2018-08-28: 2 mL via INTRAVENOUS
  Filled 2018-08-28: qty 10

## 2018-08-28 MED ORDER — CARBAMAZEPINE ER 100 MG PO TB12
100.0000 mg | ORAL_TABLET | Freq: Two times a day (BID) | ORAL | Status: DC
  Administered 2018-08-28 – 2018-09-03 (×14): 100 mg via ORAL
  Filled 2018-08-28 (×16): qty 1

## 2018-08-28 MED ORDER — ONDANSETRON HCL 4 MG PO TABS: 4.0000 mg | ORAL_TABLET | Freq: Four times a day (QID) | ORAL | Status: DC | PRN

## 2018-08-28 NOTE — H&P (Signed)
[facility] [place] NAME: Alex Daniel    MR#:  [id number]  DATE OF BIRTH:  Jan 22, 1941  DATE OF ADMISSION:  08/27/2018  PRIMARY CARE PHYSICIAN: System, Pcp Not In   REQUESTING/REFERRING PHYSICIAN: Merlyn Lot, MD  CHIEF COMPLAINT:   Chief Complaint  Patient presents with  . Fall  . Weakness    HISTORY OF PRESENT ILLNESS:  Alex Daniel  is a 78 y.o. male with a known history of COPD (2L [place] home O2), reported CHF (no prior Echo results available), Afib (Eliquis) p/w exertional dyspnea, leg edema, syncope, renal failure. No prior labwork available for comparison. AAOx3. Pt endorses 92mo Hx progressively-worsening leg edema. PT saw PCP for SOB on Tuesday 02/11. Lasix increased from 40mg  PO qD to 40mg  PO BID. Pt endorses increased urinary frequency/output since then. Was walking to the bathroom @~1900PM Nancy Fetter 02/16), syncope/collapse. Pt unable to get up w/ assistance, EMS called.  Appers reasonably well. 3+ B/L LE edema. States he used to work [employer], spent a lot of time on his feet, may have some degree of venous insufficiency. States his cardiolist is based out of [place], but he cannot remember the name. Pt's wife passed away ~2wks ago.  PAST MEDICAL HISTORY:   Past Medical History:  Diagnosis Date  . COPD (chronic obstructive pulmonary disease) ([place])   . Hypertension   . Parkinson disease ([place])   . Renal disorder     PAST SURGICAL HISTORY:  History reviewed. No pertinent surgical history.  SOCIAL HISTORY:   Social History   Tobacco Use  . Smoking status: Never Smoker  . Smokeless tobacco: Never Used  Substance Use Topics  . Alcohol use: Not Currently    Frequency: Never    FAMILY HISTORY:  History reviewed. No pertinent family history.  DRUG ALLERGIES:  No Known Allergies  REVIEW OF SYSTEMS:   Review of Systems  Constitutional: Positive for malaise/fatigue. Negative for chills,  diaphoresis, fever and weight loss.  HENT: Negative for congestion, ear pain, hearing loss, nosebleeds, sinus pain, sore throat and tinnitus.   Eyes: Negative for blurred vision, double vision and photophobia.  Respiratory: Positive for shortness of breath. Negative for cough, hemoptysis, sputum production and wheezing.   Cardiovascular: Positive for leg swelling. Negative for chest pain, palpitations, orthopnea, claudication and PND.  Gastrointestinal: Negative for abdominal pain, blood in stool, constipation, diarrhea, heartburn, melena, nausea and vomiting.  Genitourinary: Positive for frequency. Negative for dysuria, flank pain, hematuria and urgency.  Musculoskeletal: Positive for falls. Negative for back pain, joint pain, myalgias and neck pain.  Skin: Negative for itching and rash.  Neurological: Positive for dizziness, loss of consciousness and weakness. Negative for tingling, tremors, sensory change, speech change, focal weakness, seizures and headaches.  Psychiatric/Behavioral: Negative for depression and memory loss. The patient is not nervous/anxious and does not have insomnia.    MEDICATIONS AT HOME:   Prior to Admission medications   Medication Sig Start Date End Date Taking? Authorizing Provider  albuterol (PROVENTIL HFA;VENTOLIN HFA) 108 (90 Base) MCG/ACT inhaler Inhale 2-4 puffs by mouth every 4 hours as needed for wheezing, cough, and/or shortness of breath 10/26/17  Yes Hinda Kehr, MD  buPROPion (WELLBUTRIN XL) 300 MG 24 hr tablet Take 300 mg by mouth daily. 08/26/18  Yes [provider]  carbamazepine (TEGRETOL XR) 100 MG 12 hr tablet Take 100 mg by mouth 2 (two) times daily. 08/14/18  Yes [provider]  clonazePAM (KLONOPIN) 0.5  MG tablet Take 0.5 mg by mouth 2 (two) times daily. 08/21/18  Yes [provider]  ELIQUIS 5 MG TABS tablet Take 5 mg by mouth 2 (two) times daily. 08/26/18  Yes [provider]  furosemide (LASIX) 40 MG tablet Take  40 mg by mouth 2 (two) times daily. 08/26/18  Yes [provider]  lisinopril (PRINIVIL,ZESTRIL) 20 MG tablet Take 20 mg by mouth 2 (two) times daily. 08/14/18  Yes [provider]  metoprolol succinate (TOPROL-XL) 50 MG 24 hr tablet Take 50 mg by mouth daily. 08/26/18  Yes [provider]  montelukast (SINGULAIR) 10 MG tablet Take 10 mg by mouth every evening. 08/09/18  Yes [provider]  tamsulosin (FLOMAX) 0.4 MG CAPS capsule Take 0.4 mg by mouth daily. 08/26/18  Yes [provider]  VIIBRYD 40 MG TABS Take 40 mg by mouth daily. 07/29/18  Yes [provider]      VITAL SIGNS:  Blood pressure 138/64, pulse 79, temperature 98.3 F (36.8 C), resp. rate (!) 22, height 5\' 11"  (1.803 m), weight 132.2 kg, SpO2 98 %.  PHYSICAL EXAMINATION:  Physical Exam Constitutional:      General: He is not in acute distress.    Appearance: He is ill-appearing. He is not toxic-appearing or diaphoretic.     Interventions: He is not intubated. HENT:     Head: Atraumatic.     Mouth/Throat:     Pharynx: Oropharynx is clear.  Eyes:     General: No scleral icterus.    Extraocular Movements: Extraocular movements intact.     Conjunctiva/sclera: Conjunctivae normal.  Neck:     Musculoskeletal: Neck supple.  Cardiovascular:     Rate and Rhythm: Normal rate. Rhythm irregular.     Heart sounds: Normal heart sounds, S1 normal and S2 normal. Heart sounds not distant. No murmur. No friction rub. No gallop. No S3 or S4 sounds.   Pulmonary:     Effort: Tachypnea present. No bradypnea, accessory muscle usage, respiratory distress or retractions. He is not intubated.     Breath sounds: Decreased air movement present. No stridor or transmitted upper airway sounds. Examination of the right-upper field reveals decreased breath sounds. Examination of the left-upper field reveals decreased breath sounds. Examination of the right-middle field reveals decreased breath sounds.  Examination of the left-middle field reveals decreased breath sounds. Examination of the right-lower field reveals decreased breath sounds. Examination of the left-lower field reveals decreased breath sounds. Decreased breath sounds present. No wheezing, rhonchi or rales.  Abdominal:     General: Bowel sounds are normal. There is no distension.     Palpations: Abdomen is soft.     Tenderness: There is no abdominal tenderness. There is no guarding or rebound.  Musculoskeletal: Normal range of motion.        General: No tenderness.     Right lower leg: Edema present.     Left lower leg: Edema present.  Lymphadenopathy:     Cervical: No cervical adenopathy.  Skin:    General: Skin is warm and dry.     Findings: No erythema or rash.  Neurological:     Mental Status: He is alert and oriented to person, place, and time. Mental status is at baseline.  Psychiatric:        Mood and Affect: Mood normal.        Behavior: Behavior normal.        Thought Content: Thought content normal.  Judgment: Judgment normal.    LABORATORY PANEL:   CBC Recent Labs  Lab 08/27/18 2015  WBC 12.4*  HGB 9.0*  HCT 28.8*  PLT 287   ------------------------------------------------------------------------------------------------------------------  Chemistries  Recent Labs  Lab 08/27/18 2015  NA 132*  K 5.0  CL 93*  CO2 31  GLUCOSE 120*  BUN 66*  CREATININE 2.44*  CALCIUM 8.4*  MG 2.6*  AST 30  ALT 30  ALKPHOS 54  BILITOT 0.4   ------------------------------------------------------------------------------------------------------------------  Cardiac Enzymes Recent Labs  Lab 08/28/18 0057  TROPONINI 0.06*   ------------------------------------------------------------------------------------------------------------------  RADIOLOGY:  Dg Chest 2 View  Result Date: 08/27/2018 CLINICAL DATA:  Pt to ED by EMS. Per daughter pt became SOB and fell while walking to bathroom. Pt has hx of  CHF and had recent Lasix dose change from 40 to 80 and has lost 20 lbs in 5 days. EMS states daughter believes he may have had a seizure.*comment was truncated*SOB EXAM: CHEST - 2 VIEW COMPARISON:  06/23/2018 FINDINGS: Normal cardiac silhouette. Mild bibasilar atelectasis. Mild peripheral linear interstitial pattern increased from prior. No effusion, infiltrate or pneumothorax. No acute osseous abnormality. IMPRESSION: Mild basilar atelectasis. Mild interstitial edema pattern Electronically Signed   By: Suzy Bouchard M.D.   On: 08/27/2018 21:12   IMPRESSION AND PLAN:   A/P: 7M w/ PMHx COPD (2L [place] home O2), reported CHF (no prior Echo results available), Afib (Eliquis) p/w SOB, exertional dyspnea, leg edema, syncope, renal failure (suspected prerenal AKI superimposed on suspected CKD III). Hypovolemic hypochloremic hyponatremia, intravascular volume depletion, hyperglycemia, uremia, hypocalcemia, hypoalbuminemia, BNP elevation, Troponin elevation, mild leukocytosis, normocytic anemia. -SOB, exertional dyspnea, leg edema, syncope, Troponin elevation: Lungs diminished but clear. Non-hypoxic on home 2L. BNP 228. CXR mild edema. Pt w/ concomitant intravascular volume depletion (discussed below), likely 2/2 diuretics. Trialed albumin + saline followed by Lasix in the ED, w/ goal to increase vascular oncotic pressure, decrease third-spacing and excrete free water; I expect this maneuver to be unsuccessful. Trop-I 0.07, 0.06, rpt pending. Increase likely 2/2 demand, impaired renal clearance (AKI, discussed below). Cardiac monitoring. ASA. Orthostatic VS, FSG qHS/AC, neuro checks q4h x24hr, fall precautions. I&O, daily weight, free water restriction. Echo pending. c/w cardiac meds. May benefit from lower extremity compression. -Hypovolemic hypochloremic hyponatremia, intravascular volume depletion, uremia, renal failure: Cr 2.44, baseline unknown. 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 Hold ACE-I. IVF as above. Renal U/S, urine studies (electrolytes, protein, creatinine, urea nitrogen). Monitor BMP, avoid nephrotoxins. -Hypocalcemia; Ionized calcium. -Hypoalbuminemia: Prealbumin. Despite morbid obesity (BMI > 40), may be paradoxically malnourished 2/2 lung disease. Ensure, Nutrition consult. -Leukocytosis: Hemoconcentration. IVF. -Normocytic anemia: Suspect anemia of chronic disease. -COPD, chronic hypoxemic respiratory failure: 2L [place] home O2, continued. PRN nebs. -c/w other home meds/formulary subs as tolerated. -FEN/GI: Renal free water restricted diet. -DVT PPx: Eliquis. -Code status: Full code. -Disposition: Admission, > 2 midnights.   All the records are reviewed and case discussed with ED provider. Management plans discussed with the patient, family and they are in agreement.  CODE STATUS: Full code.  TOTAL TIME TAKING CARE OF THIS PATIENT: 75 minutes.    Arta Silence M.D on 08/28/2018 at 2:06 AM  Between 7am to 6pm - Pager - [phone]  After 6pm go to [email or web address] - Proofreader  [facility]  Office  [phone]  CC: Primary care physician; System, Pcp Not In   Note: This dictation was prepared with Dragon dictation along with smaller phrase  technology. Any transcriptional errors that result from this process are unintentional.

## 2018-08-28 NOTE — ED Notes (Signed)
Pt up to BR at this time.

## 2018-08-28 NOTE — Progress Notes (Signed)
*  PRELIMINARY RESULTS* Echocardiogram 2D Echocardiogram has been performed.  Alex Daniel 08/28/2018, 6:21 PM

## 2018-08-28 NOTE — ED Notes (Signed)
ED TO INPATIENT HANDOFF REPORT  ED Nurse Name and Phone #: Tera Mater  S Name/Age/Gender Alex Daniel 78 y.o. male Room/Bed: ED03A/ED03A  Code Status   Code Status: Full Code  Home/SNF/Other Home Patient oriented to: self, place, time and situation Is this baseline? Yes   Triage Complete: Triage complete  Chief Complaint [facility]  Triage Note Pt to ED by EMS. Per daughter pt became SOB and fell while walking to bathroom. Pt has hx of CHF and had recent Lasix dose change from 40 to 80 and has lost 20 lbs in 5 days. EMS states daughter believes he may have had a seizure she noted shaking and per EMS pt was incontinent.    Allergies No Known Allergies  Level of Care/Admitting Diagnosis ED Disposition    ED Disposition Condition [facility] Area: [place] [[id number]]  Level of Care: Telemetry [5]  Diagnosis: SOB (shortness of breath) [161096]  Admitting Physician: Arta Silence [[id number]]  Attending Physician: Arta Silence [[id number]]  Estimated length of stay: past midnight tomorrow  Certification:: I certify this patient will need inpatient services for at least 2 midnights  PT Class (Do Not Modify): Inpatient [101]  PT Acc Code (Do Not Modify): Private [1]       B Medical/Surgery History Past Medical History:  Diagnosis Date  . COPD (chronic obstructive pulmonary disease) ([place])   . Hypertension   . Parkinson disease ([place])   . Renal disorder    History reviewed. No pertinent surgical history.   A IV Location/Drains/Wounds Patient Lines/Drains/Airways Status   Active Line/Drains/Airways    Name:   Placement date:   Placement time:   Site:   Days:   Peripheral IV 08/27/18 Left Hand   08/27/18    -    Hand   1          Intake/Output Last 24 hours  Intake/Output Summary (Last 24 hours) at 08/28/2018 0706 Last data filed at 08/28/2018 0603 Gross per 24 hour  Intake 240 ml  Output 700 ml  Net -460 ml     Labs/Imaging Results for orders placed or performed during the hospital encounter of 08/27/18 (from the past 48 hour(s))  Basic metabolic panel     Status: Abnormal   Collection Time: 08/27/18  8:15 PM  Result Value Ref Range   Sodium 132 (L) 135 - 145 mmol/L   Potassium 5.0 3.5 - 5.1 mmol/L   Chloride 93 (L) 98 - 111 mmol/L   CO2 31 22 - 32 mmol/L   Glucose, Bld 120 (H) 70 - 99 mg/dL   BUN 66 (H) 8 - 23 mg/dL   Creatinine, Ser 2.44 (H) 0.61 - 1.24 mg/dL   Calcium 8.4 (L) 8.9 - 10.3 mg/dL   GFR calc non Af Amer 25 (L) >60 mL/min   GFR calc Af Amer 29 (L) >60 mL/min   Anion gap 8 5 - 15    Comment: Performed at [facility], [address] [place], [place] [address]  CBC     Status: Abnormal   Collection Time: 08/27/18  8:15 PM  Result Value Ref Range   WBC 12.4 (H) 4.0 - 10.5 K/uL   RBC 3.01 (L) 4.22 - 5.81 MIL/uL   Hemoglobin 9.0 (L) 13.0 - 17.0 g/dL   HCT 28.8 (L) 39.0 - 52.0 %   MCV 95.7 80.0 - 100.0 fL   MCH 29.9 26.0 - 34.0 pg   MCHC 31.3 30.0 -  36.0 g/dL   RDW 14.4 11.5 - 15.5 %   Platelets 287 150 - 400 K/uL   nRBC 0.0 0.0 - 0.2 %    Comment: Performed at [facility], [address] [place], [place] [address]  Troponin I - ONCE - STAT     Status: Abnormal   Collection Time: 08/27/18  8:15 PM  Result Value Ref Range   Troponin I 0.07 (HH) <0.03 ng/mL    Comment: CRITICAL RESULT CALLED TO, READ BACK BY AND VERIFIED WITH KIM MAIN RN AT 2110 08/27/2018. MSS Performed at [facility], [address] [place], [place] [address]   Brain natriuretic peptide     Status: Abnormal   Collection Time: 08/27/18  8:15 PM  Result Value Ref Range   B Natriuretic Peptide 228.0 (H) 0.0 - 100.0 pg/mL    Comment: Performed at [facility], [address] [place], [place] [address]  Hepatic function panel     Status: Abnormal   Collection Time: 08/27/18  8:15 PM  Result Value Ref Range   Total Protein 7.4 6.5 - 8.1 g/dL   Albumin 3.2  (L) 3.5 - 5.0 g/dL   AST 30 15 - 41 U/L   ALT 30 0 - 44 U/L   Alkaline Phosphatase 54 38 - 126 U/L   Total Bilirubin 0.4 0.3 - 1.2 mg/dL   Bilirubin, Direct 0.1 0.0 - 0.2 mg/dL   Indirect Bilirubin 0.3 0.3 - 0.9 mg/dL    Comment: Performed at [facility], [address], [place], [place] [address]  Magnesium     Status: Abnormal   Collection Time: 08/27/18  8:15 PM  Result Value Ref Range   Magnesium 2.6 (H) 1.7 - 2.4 mg/dL    Comment: Performed at [facility], [address] [place], [place] [address]  Phosphorus     Status: None   Collection Time: 08/27/18  8:15 PM  Result Value Ref Range   Phosphorus 3.9 2.5 - 4.6 mg/dL    Comment: Performed at [facility], [address] [place], [place] [address]  Urinalysis, Complete w Microscopic     Status: Abnormal   Collection Time: 08/28/18 12:25 AM  Result Value Ref Range   Color, Urine STRAW (A) YELLOW   APPearance CLEAR (A) CLEAR   Specific Gravity, Urine 1.006 1.005 - 1.030   pH 5.0 5.0 - 8.0   Glucose, UA NEGATIVE NEGATIVE mg/dL   Hgb urine dipstick SMALL (A) NEGATIVE   Bilirubin Urine NEGATIVE NEGATIVE   Ketones, ur NEGATIVE NEGATIVE mg/dL   Protein, ur NEGATIVE NEGATIVE mg/dL   Nitrite NEGATIVE NEGATIVE   Leukocytes,Ua LARGE (A) NEGATIVE   RBC / HPF 0-5 0 - 5 RBC/hpf   WBC, UA 21-50 0 - 5 WBC/hpf   Bacteria, UA NONE SEEN NONE SEEN   Squamous Epithelial / LPF 0-5 0 - 5   Mucus PRESENT     Comment: Performed at [facility], [address] [place], [place] [address]  Na and K (sodium & potassium), rand urine     Status: None   Collection Time: 08/28/18 12:25 AM  Result Value Ref Range   Sodium, Ur 41 mmol/L   Potassium Urine 13 mmol/L    Comment: Performed at [facility], [address] [place], [place] [address]  Creatinine, urine, random     Status: None   Collection Time: 08/28/18 12:25 AM  Result Value Ref Range   Creatinine, Urine 39 mg/dL  Comment:  Performed at [facility], [address] [place], [place] [address]  Protein, urine, random     Status: None   Collection Time: 08/28/18 12:25 AM  Result Value Ref Range   Total Protein, Urine 7 mg/dL    Comment: NO NORMAL RANGE ESTABLISHED FOR THIS TEST Performed at [facility], [address] [place], [place] [address]   Troponin I - Once     Status: Abnormal   Collection Time: 08/28/18 12:57 AM  Result Value Ref Range   Troponin I 0.06 (HH) <0.03 ng/mL    Comment: CRITICAL VALUE NOTED. VALUE IS CONSISTENT WITH PREVIOUSLY REPORTED/CALLED VALUE [facility] Performed at [facility], [place], [place] [address]   Troponin I - Tomorrow AM 0500     Status: Abnormal   Collection Time: 08/28/18  5:58 AM  Result Value Ref Range   Troponin I 0.06 (HH) <0.03 ng/mL    Comment: CRITICAL VALUE NOTED. VALUE IS CONSISTENT WITH PREVIOUSLY REPORTED/CALLED VALUE [facility] Performed at [facility], [address] [place], [place] [address]   Basic metabolic panel     Status: Abnormal   Collection Time: 08/28/18  5:58 AM  Result Value Ref Range   Sodium 136 135 - 145 mmol/L   Potassium 5.0 3.5 - 5.1 mmol/L   Chloride 98 98 - 111 mmol/L   CO2 30 22 - 32 mmol/L   Glucose, Bld 110 (H) 70 - 99 mg/dL   BUN 64 (H) 8 - 23 mg/dL   Creatinine, Ser 2.30 (H) 0.61 - 1.24 mg/dL   Calcium 8.5 (L) 8.9 - 10.3 mg/dL   GFR calc non Af Amer 27 (L) >60 mL/min   GFR calc Af Amer 31 (L) >60 mL/min   Anion gap 8 5 - 15    Comment: Performed at [facility], [address], [place],  [address]   Dg Chest 2 View  Result Date: 08/27/2018 CLINICAL DATA:  Pt to ED by EMS. Per daughter pt became SOB and fell while walking to bathroom. Pt has hx of CHF and had recent Lasix dose change from 40 to 80 and has lost 20 lbs in 5 days. EMS states daughter believes he may have had a seizure.*comment was truncated*SOB EXAM: CHEST - 2 VIEW COMPARISON:  06/23/2018  FINDINGS: Normal cardiac silhouette. Mild bibasilar atelectasis. Mild peripheral linear interstitial pattern increased from prior. No effusion, infiltrate or pneumothorax. No acute osseous abnormality. IMPRESSION: Mild basilar atelectasis. Mild interstitial edema pattern Electronically Signed   By: Suzy Bouchard M.D.   On: 08/27/2018 21:12   US Renal  Result Date: 08/28/2018 CLINICAL DATA:  Acute kidney injury EXAM: RENAL / URINARY TRACT ULTRASOUND COMPLETE COMPARISON:  None. FINDINGS: Right Kidney: Renal measurements: 10 x 6 x 5 cm. Cortical thinning and increased echogenicity. Upper pole cyst measuring 2.7 cm. No hydronephrosis Left Kidney: Renal measurements: 10 x 6 x 5 cm. Cortical thinning and increased echogenicity. No hydronephrosis. Bladder: Appears normal for degree of bladder distention. IMPRESSION: 1. Medical renal disease. 2. No hydronephrosis. Electronically Signed   By: Monte Fantasia M.D.   On: 08/28/2018 04:17    Pending Labs Unresulted Labs (From admission, onward)    Start     Ordered   08/27/18 2300  Carbamazepine, Free and Total  Once,   STAT     08/27/18 2252   08/27/18 2300  Calcium, ionized  Once,   STAT     08/27/18 2252   08/27/18  2300  Prealbumin  Once,   STAT    Question:  Specimen collection method  Answer:  Unit=Unit collect   08/27/18 2253   08/27/18 2239  Urea nitrogen, urine  Once,   STAT     08/27/18 2239          Vitals/Pain Today's Vitals   08/28/18 0030 08/28/18 0320 08/28/18 0552 08/28/18 0704  BP: 138/64 138/64 107/76   Pulse: 79 76 68   Resp: (!) 22 20 20    Temp:      SpO2: 98% 97% 97%   Weight:      Height:      PainSc:   Asleep Asleep    Isolation Precautions No active isolations  Medications Medications  sodium chloride flush (NS) 0.9 % injection 3 mL (3 mLs Intravenous Not Given 08/27/18 2323)  aspirin chewable tablet 81 mg (has no administration in time range)  senna-docusate (Senokot-S) tablet 1 tablet (has no administration  in time range)  bisacodyl (DULCOLAX) EC tablet 5 mg (has no administration in time range)  ondansetron (ZOFRAN) tablet 4 mg ( Oral See Alternative 08/28/18 0155)    Or  ondansetron (ZOFRAN) injection 4 mg (4 mg Intravenous Given 08/28/18 0155)  acetaminophen (TYLENOL) tablet 650 mg (has no administration in time range)    Or  acetaminophen (TYLENOL) suppository 650 mg (has no administration in time range)  albuterol (PROVENTIL) (2.5 MG/3ML) 0.083% nebulizer solution 2.5 mg (has no administration in time range)  buPROPion (WELLBUTRIN XL) 24 hr tablet 300 mg (has no administration in time range)  carbamazepine (TEGRETOL XR) 12 hr tablet 100 mg (100 mg Oral Given 08/28/18 0355)  clonazePAM (KLONOPIN) tablet 0.5 mg (0.5 mg Oral Given 08/28/18 0306)  apixaban (ELIQUIS) tablet 5 mg (5 mg Oral Given 08/28/18 0307)  metoprolol succinate (TOPROL-XL) 24 hr tablet 50 mg (has no administration in time range)  montelukast (SINGULAIR) tablet 10 mg (has no administration in time range)  tamsulosin (FLOMAX) capsule 0.4 mg (has no administration in time range)  Vilazodone HCl (VIIBRYD) TABS 40 mg (has no administration in time range)  feeding supplement (ENSURE ENLIVE) (ENSURE ENLIVE) liquid 237 mL (has no administration in time range)  ondansetron (ZOFRAN) injection 4 mg (4 mg Intravenous Not Given 08/28/18 0206)  albumin human 25 % solution 12.5 g (0 g Intravenous Stopped 08/28/18 0012)  sodium chloride 0.9 % bolus 1,000 mL (0 mLs Intravenous Stopped 08/28/18 0114)  furosemide (LASIX) injection 20 mg (20 mg Intravenous Given 08/28/18 0105)    Mobility walks with person assist Low fall risk   Focused Assessments    R Recommendations: See Admitting Provider Note  Report given to:   Additional Notes:

## 2018-08-28 NOTE — Progress Notes (Signed)
Initial Nutrition Assessment  DOCUMENTATION CODES:   Obesity unspecified  INTERVENTION:  Ensure Enlive po BID, each supplement provides 350 kcal and 20 grams of protein (pt likes chocolate) MVI Provided patient and daughter with CHF NTR edu handouts  NUTRITION DIAGNOSIS:   Predicted suboptimal nutrient intake related to chronic illness, decreased appetite(COPD on 2L home O2, CHF) as evidenced by energy intake < 75% for > 7 days, estimated needs.   GOAL:   Patient will meet greater than or equal to 90% of their needs   MONITOR:   PO intake, Supplement acceptance, Weight trends, Skin, I & O's, Labs  REASON FOR ASSESSMENT:   Consult Assessment of nutrition requirement/status  ASSESSMENT:  78 year old male with known history of COPD on 2L O2 at home, CHF, HTN, presented to ED with 2 month hx of worsening leg edema, SOB, and reported syncope/collapse   Patient sitting in bedside chair with daughter in room at visit. Patient lives in [place] another daughter nearby. Patient lost his wife 2 weeks ago and reports decreased appetite and poor dietary habits with comfort foods being dropped at family home during that time. Patient has recently began drinking the remaining Ensure that was purchased for his wife on days that he does not feel like eating much.   Lunch tray delivered while RD in room, pt reports feeling hungry and looking forward to eating.   Patient's daughter reports that patient began taking Lasix last May and his last known wt prior to needing daily Lasix was 275 lbs. Currently patient is 290 lbs with +3/+4 BLE edema. Patient meets requirements for morbid obesity at this time; suspect due to fluid accumulation.   Daughter requested RD to bring CHF nutrition education, printed HH, low Na/fluid edu and took to room.   Noted that patient has renal disorder, phosphorus WDL, slightly elevated 2/16 Mg 2.6. 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  Medications reviewed and include:  Eliquis, carbamazepine, zofran  Labs:  NUTRITION - FOCUSED PHYSICAL EXAM:    Most Recent Value  Orbital Region  Mild depletion  Upper Arm Region  Mild depletion  Thoracic and Lumbar Region  No depletion  Buccal Region  Moderate depletion  Temple Region  Mild depletion  Clavicle Bone Region  No depletion  Clavicle and Acromion Bone Region  No depletion  Scapular Bone Region  No depletion  Dorsal Hand  Mild depletion  Patellar Region  Unable to assess  Anterior Thigh Region  Unable to assess  Posterior Calf Region  Unable to assess  Edema (RD Assessment)  Severe [BLE,  +3/+4 ]  Hair  Reviewed  Eyes  Reviewed  Mouth  Reviewed  Skin  Reviewed  Nails  Reviewed       Diet Order:   Diet Order            Diet renal with fluid restriction Fluid restriction: 1500 mL Fluid; Room service appropriate? Yes; Fluid consistency: Thin  Diet effective now              EDUCATION NEEDS:   Education needs have been addressed  Skin:  Skin Assessment: Reviewed RN Assessment(ecchymosis; bilateral arms)  Last BM:  unknown  Height:   Ht Readings from Last 1 Encounters:  08/27/18 5\' 11"  (1.803 m)    Weight:   Wt Readings from Last 1 Encounters:  08/27/18 132.2 kg    Ideal Body Weight:  78.2 kg  BMI:  Body mass index is 40.65 kg/m.  Estimated Nutritional  Needs:   Kcal:  2100-2350 (28-30g/kg/IBW)  Protein:  102-125 grams (1.3-1.6g/kg/IBW)  Fluid:  1587mL/day per MD    Lajuan Lines, RD, LDN  After Hours/Weekend Pager: [phone]

## 2018-08-28 NOTE — ED Notes (Signed)
Pt assisted to BR by this RN

## 2018-08-28 NOTE — ED Notes (Signed)
Pt assisted to BR by this RN.

## 2018-08-28 NOTE — Plan of Care (Addendum)
CHF packet given to pt, reviewed daily weights, per pt does have a scale at home, low sodium diet, per husband wife passed away 3 weeks ago and he has not been watching his diet, also went over green zone, yellow zone, and red zone signs and symptoms. Pt and daughter agreeable to watch EMI videos.  Problem: Education: Goal: Individualized Educational Video(s) Outcome: Progressing   Problem: Activity: Goal: Capacity to carry out activities will improve Outcome: Progressing   Problem: Cardiac: Goal: Ability to achieve and maintain adequate cardiopulmonary perfusion will improve Outcome: Progressing   Problem: Education: Goal: Knowledge of General Education information will improve Description Including pain rating scale, medication(s)/side effects and non-pharmacologic comfort measures Outcome: Progressing

## 2018-08-28 NOTE — ED Notes (Signed)
Attempt to call report X 2 unsuccessful.

## 2018-08-28 NOTE — Progress Notes (Signed)
Patient ambulated to BR with an even steady gait.  Patient has increased shortness of breath.  This RN will administer a PRN albuterol nebulizer.    Patient's family member asking if those are available for home use.  Will notify day shift RN for CM consult.

## 2018-08-28 NOTE — ED Notes (Signed)
Pt placed in hospital bed for comfort. Pt also given diet ginger ale and graham crackers at this time.

## 2018-08-28 NOTE — ED Notes (Signed)
Attempt to call report X 1 unsuccessful.

## 2018-08-28 NOTE — Progress Notes (Addendum)
[place] [facility] [place] NAME: Alex Daniel    MR#:  [id number]  DATE OF BIRTH:  [birth year]  SUBJECTIVE:   Patient states he is feeling fine this morning.  He has continued significant lower extremity edema.  Denies any shortness of breath.  He feels like his breathing is at baseline.  REVIEW OF SYSTEMS:  Review of Systems  Constitutional: Negative for chills and fever.  HENT: Negative for congestion and sore throat.   Eyes: Negative for blurred vision and double vision.  Respiratory: Negative for cough and shortness of breath.   Cardiovascular: Positive for leg swelling. Negative for chest pain.  Gastrointestinal: Negative for nausea and vomiting.  Genitourinary: Negative for dysuria and urgency.  Musculoskeletal: Negative for back pain and neck pain.  Neurological: Negative for dizziness and headaches.  Psychiatric/Behavioral: Negative for depression. The patient is not nervous/anxious.     DRUG ALLERGIES:  No Known Allergies VITALS:  Blood pressure 125/67, pulse 91, temperature 98 F (36.7 C), temperature source Oral, resp. rate 18, height 5\' 11"  (1.803 m), weight 132.2 kg, SpO2 96 %. PHYSICAL EXAMINATION:  Physical Exam  Constitutional: Well-appearing, sitting up on edge of bed in no acute distress HEENT: Normocephalic, atraumatic, EOMI, no scleral icterus, moist mucous membranes  Neck: Supple, normal range of motion Cardiovascular: RRR, no murmur, rubs, gallops Respiratory:  Diminished breath sounds in the lung bases bilaterally, no wheezes or crackles, normal work of breathing, nasal cannula in place Gastrointestinal:+BS, soft, nondistended, nontender Musculoskeletal: 2+ edema to the knee bilaterally, no cyanosis or clubbing Neurologic:   CN II through XII grossly intact, no focal deficits, sensation intact throughout Skin:  Skin is warm, dry and intact. No rash noted. Psychiatric: Mood and affect are normal. Speech and behavior  are normal. LABORATORY PANEL:  Male CBC Recent Labs  Lab 08/27/18 2015  WBC 12.4*  HGB 9.0*  HCT 28.8*  PLT 287   ------------------------------------------------------------------------------------------------------------------ Chemistries  Recent Labs  Lab 08/27/18 2015 08/28/18 0558  NA 132* 136  K 5.0 5.0  CL 93* 98  CO2 31 30  GLUCOSE 120* 110*  BUN 66* 64*  CREATININE 2.44* 2.30*  CALCIUM 8.4* 8.5*  MG 2.6*  --   AST 30  --   ALT 30  --   ALKPHOS 54  --   BILITOT 0.4  --    RADIOLOGY:  Dg Chest 2 View  Result Date: 08/27/2018 CLINICAL DATA:  Pt to ED by EMS. Per daughter pt became SOB and fell while walking to bathroom. Pt has hx of CHF and had recent Lasix dose change from 40 to 80 and has lost 20 lbs in 5 days. EMS states daughter believes he may have had a seizure.*comment was truncated*SOB EXAM: CHEST - 2 VIEW COMPARISON:  06/23/2018 FINDINGS: Normal cardiac silhouette. Mild bibasilar atelectasis. Mild peripheral linear interstitial pattern increased from prior. No effusion, infiltrate or pneumothorax. No acute osseous abnormality. IMPRESSION: Mild basilar atelectasis. Mild interstitial edema pattern Electronically Signed   By: Suzy Bouchard M.D.   On: 08/27/2018 21:12   US Renal  Result Date: 08/28/2018 CLINICAL DATA:  Acute kidney injury EXAM: RENAL / URINARY TRACT ULTRASOUND COMPLETE COMPARISON:  None. FINDINGS: Right Kidney: Renal measurements: 10 x 6 x 5 cm. Cortical thinning and increased echogenicity. Upper pole cyst measuring 2.7 cm. No hydronephrosis Left Kidney: Renal measurements: 10 x 6 x 5 cm. Cortical thinning and increased echogenicity. No hydronephrosis. Bladder: Appears normal for degree of bladder  distention. IMPRESSION: 1. Medical renal disease. 2. No hydronephrosis. Electronically Signed   By: Monte Fantasia M.D.   On: 08/28/2018 04:17   ASSESSMENT AND PLAN:   Bilateral lower extremity edema- possibly secondary to acute CHF exacerbation.   Chest x-ray does show some mild interstitial edema.  BNP 228.  No previous echo available.  May also be a component of chronic venous insufficiency. -Echo pending -TED hose ordered -Advised patient to elevate legs as much as possible  AKI in ?CKD 3- possibly due to intravascular depletion.  Received albumin plus saline, followed by Lasix in the ED.  Creatinine has improved from 2.44 > 2.30.  Unclear baseline. -Renal US with medical renal disease -Avoid nephrotoxic agents -Monitor   Elevated troponin- likely secondary to demand ischemia.  Troponins mildly elevated and flat no active chest pain. -Monitor  Chronic hypoxic respiratory failure secondary to COPD-on 2 L O2 at home.  No signs of acute COPD exacerbation. -Continue supplemental oxygen  Chronic A. fib- rate controlled -Continue Eliquis and metoprolol  Normocytic anemia- hemoglobin 9.0.  No previous values for comparison. -Check anemia panel in the morning -Monitor  Depression-stable Continue the vilazodone and Wellbutrin  All the records are reviewed and case discussed with Care Management/Social Worker. Management plans discussed with the patient, family and they are in agreement.  CODE STATUS: Full Code  TOTAL TIME TAKING CARE OF THIS PATIENT: 40 minutes.   More than 50% of the time was spent in counseling/coordination of care: YES  POSSIBLE D/C IN 1-2 DAYS, DEPENDING ON CLINICAL CONDITION.   Berna Spare Destanie Tibbetts M.D on 08/28/2018 at 12:37 PM  Between 7am to 6pm - Pager - [phone]  After 6pm go to [email or web address] - Proofreader  [facility]  Office  [phone]  CC: Primary care physician; System, Pcp Not In  Note: This dictation was prepared with Dragon dictation along with smaller phrase technology. Any transcriptional errors that result from this process are unintentional.

## 2018-08-28 NOTE — ED Notes (Signed)
Report to Erica, RN

## 2018-08-29 ENCOUNTER — Inpatient Hospital Stay: Payer: Medicare Other

## 2018-08-29 LAB — BASIC METABOLIC PANEL
Anion gap: 5 (ref 5–15)
BUN: 76 mg/dL — ABNORMAL HIGH (ref 8–23)
CO2: 31 mmol/L (ref 22–32)
Calcium: 8.8 mg/dL — ABNORMAL LOW (ref 8.9–10.3)
Chloride: 100 mmol/L (ref 98–111)
Creatinine, Ser: 2.46 mg/dL — ABNORMAL HIGH (ref 0.61–1.24)
GFR calc Af Amer: 29 mL/min — ABNORMAL LOW (ref >60)
GFR calc non Af Amer: 25 mL/min — ABNORMAL LOW (ref >60)
Glucose, Bld: 116 mg/dL — ABNORMAL HIGH (ref 70–99)
Potassium: 5.4 mmol/L — ABNORMAL HIGH (ref 3.5–5.1)
Sodium: 136 mmol/L (ref 135–145)

## 2018-08-29 LAB — CBC
HEMATOCRIT: 30.2 % — AB (ref 39.0–52.0)
Hemoglobin: 9 g/dL — ABNORMAL LOW (ref 13.0–17.0)
MCH: 29.5 pg (ref 26.0–34.0)
MCHC: 29.8 g/dL — ABNORMAL LOW (ref 30.0–36.0)
MCV: 99 fL (ref 80.0–100.0)
Platelets: 326 10*3/uL (ref 150–400)
RBC: 3.05 MIL/uL — ABNORMAL LOW (ref 4.22–5.81)
RDW: 14.8 % (ref 11.5–15.5)
WBC: 12.4 10*3/uL — AB (ref 4.0–10.5)
nRBC: 0 % (ref 0.0–0.2)

## 2018-08-29 LAB — VITAMIN B12: VITAMIN B 12: 266 pg/mL (ref 180–914)

## 2018-08-29 LAB — ECHOCARDIOGRAM COMPLETE
HEIGHTINCHES: 71 in
Weight: 4663.17 oz

## 2018-08-29 LAB — RETICULOCYTES
Immature Retic Fract: 25 % — ABNORMAL HIGH (ref 2.3–15.9)
RBC.: 3.05 MIL/uL — ABNORMAL LOW (ref 4.22–5.81)
RETIC COUNT ABSOLUTE: 84.5 10*3/uL (ref 19.0–186.0)
Retic Ct Pct: 2.8 % (ref 0.4–3.1)

## 2018-08-29 LAB — FERRITIN: Ferritin: 60 ng/mL (ref 24–336)

## 2018-08-29 LAB — CALCIUM, IONIZED: Calcium, Ionized, Serum: 4.5 mg/dL (ref 4.5–5.6)

## 2018-08-29 LAB — FOLATE: Folate: 8.4 ng/mL (ref >5.9)

## 2018-08-29 LAB — UREA NITROGEN, URINE: Urea Nitrogen, Ur: 345 mg/dL

## 2018-08-29 LAB — IRON AND TIBC
Iron: 50 ug/dL (ref 45–182)
Saturation Ratios: 16 % — ABNORMAL LOW (ref 17.9–39.5)
TIBC: 314 ug/dL (ref 250–450)
UIBC: 264 ug/dL

## 2018-08-29 LAB — CARBAMAZEPINE, FREE AND TOTAL
Carbamazepine, Free: 1.3 ug/mL (ref 0.6–4.2)
Carbamazepine, Total: 4.2 ug/mL (ref 4.0–12.0)

## 2018-08-29 MED ORDER — BENZONATATE 100 MG PO CAPS
200.0000 mg | ORAL_CAPSULE | Freq: Three times a day (TID) | ORAL | Status: DC | PRN
  Administered 2018-08-29 – 2018-08-30 (×3): 200 mg via ORAL
  Filled 2018-08-29 (×3): qty 2

## 2018-08-29 MED ORDER — SODIUM POLYSTYRENE SULFONATE 15 GM/60ML PO SUSP
15.0000 g | Freq: Once | ORAL | Status: AC
  Administered 2018-08-29: 15 g via ORAL
  Filled 2018-08-29: qty 60

## 2018-08-29 MED ORDER — PATIROMER SORBITEX CALCIUM 8.4 G PO PACK: 8.4000 g | PACK | Freq: Every day | ORAL | Status: DC

## 2018-08-29 MED ORDER — GUAIFENESIN-CODEINE 100-10 MG/5ML PO SOLN: 5.0000 mL | ORAL | Status: DC | PRN

## 2018-08-29 NOTE — Consult Note (Signed)
Date: 08/29/2018                  Patient Name:  Alex Daniel  MRN: [id number]  DOB: 29-Mar-1941  Age / Sex: 78 y.o., male         PCP: System, Pcp Not In                 Service Requesting Consult: IM/ Mayo, Pete Pelt, MD                 Reason for Consult: CKD, EDEMA            History of Present Illness: Patient is a 78 y.o. male with medical problems of COPD, HTN, Parkinsons, CKD, who was admitted to [facility] on 08/27/2018 for evaluation of post fall.  Patient states that he recently moved from [place] to live with his daughter locally.  He has been having some lung congestion.  He became short of breath at home and his legs gave out therefore he was brought to the emergency room for evaluation.   Patient has known history of chronic kidney disease and was last seen by nephrologist about a year ago.  He did not go for follow-up.  Family has requested his outpatient records for review.  Fax number provided Serum creatinine during this hospitalization is staying in the mid 2 range with GFR in the mid 20s Patient has significantly more edema in his left leg compared to right His appetite is fair.  Denies any nausea or vomiting.   Medications: Outpatient medications: Medications Prior to Admission  Medication Sig Dispense Refill Last Dose  . albuterol (PROVENTIL HFA;VENTOLIN HFA) 108 (90 Base) MCG/ACT inhaler Inhale 2-4 puffs by mouth every 4 hours as needed for wheezing, cough, and/or shortness of breath 1 Inhaler 1 prn at prn  . buPROPion (WELLBUTRIN XL) 300 MG 24 hr tablet Take 300 mg by mouth daily.   08/27/2018 at Unknown time  . carbamazepine (TEGRETOL XR) 100 MG 12 hr tablet Take 100 mg by mouth 2 (two) times daily.   08/27/2018 at Unknown time  . clonazePAM (KLONOPIN) 0.5 MG tablet Take 0.5 mg by mouth 2 (two) times daily.   08/27/2018 at Unknown time  . ELIQUIS 5 MG TABS tablet Take 5 mg by mouth 2 (two) times daily.   08/27/2018 at Unknown time  . furosemide (LASIX) 40 MG tablet  Take 40 mg by mouth 2 (two) times daily.   08/27/2018 at Unknown time  . lisinopril (PRINIVIL,ZESTRIL) 20 MG tablet Take 20 mg by mouth 2 (two) times daily.   08/27/2018 at Unknown time  . metoprolol succinate (TOPROL-XL) 50 MG 24 hr tablet Take 50 mg by mouth daily.   08/26/2018 at Unknown time  . montelukast (SINGULAIR) 10 MG tablet Take 10 mg by mouth every evening.   08/26/2018 at Unknown time  . tamsulosin (FLOMAX) 0.4 MG CAPS capsule Take 0.4 mg by mouth daily.   08/26/2018 at Unknown time  . VIIBRYD 40 MG TABS Take 40 mg by mouth daily.   08/27/2018 at Unknown time    Current medications: Current Facility-Administered Medications  Medication Dose Route Frequency Provider Last Rate Last Dose  . acetaminophen (TYLENOL) tablet 650 mg  650 mg Oral Q6H PRN Arta Silence, MD   650 mg at 08/28/18 1027   Or  . acetaminophen (TYLENOL) suppository 650 mg  650 mg Rectal Q6H PRN Arta Silence, MD      . albuterol (PROVENTIL) (2.5 MG/3ML) 0.083%  nebulizer solution 2.5 mg  2.5 mg Nebulization Q6H PRN Arta Silence, MD   2.5 mg at 08/28/18 2033  . apixaban (ELIQUIS) tablet 5 mg  5 mg Oral BID Arta Silence, MD   5 mg at 08/29/18 1030  . aspirin chewable tablet 81 mg  81 mg Oral Daily Arta Silence, MD   81 mg at 08/29/18 1030  . benzonatate (TESSALON) capsule 200 mg  200 mg Oral TID PRN Arta Silence, MD   200 mg at 08/29/18 1030  . bisacodyl (DULCOLAX) EC tablet 5 mg  5 mg Oral Daily PRN Arta Silence, MD      . buPROPion (WELLBUTRIN XL) 24 hr tablet 300 mg  300 mg Oral Daily Arta Silence, MD   300 mg at 08/29/18 1030  . carbamazepine (TEGRETOL XR) 12 hr tablet 100 mg  100 mg Oral BID Arta Silence, MD   100 mg at 08/29/18 1030  . clonazePAM (KLONOPIN) tablet 0.5 mg  0.5 mg Oral QHS Mayo, Pete Pelt, MD      . feeding supplement (ENSURE ENLIVE) (ENSURE ENLIVE) liquid 237 mL  237 mL Oral BID BM Arta Silence, MD   237 mL at 08/28/18 1335  .  guaiFENesin-codeine 100-10 MG/5ML solution 5 mL  5 mL Oral Q4H PRN Arta Silence, MD      . MEDLINE mouth rinse  15 mL Mouth Rinse BID Mayo, Pete Pelt, MD   15 mL at 08/28/18 2154  . metoprolol succinate (TOPROL-XL) 24 hr tablet 50 mg  50 mg Oral Daily Arta Silence, MD   50 mg at 08/29/18 1030  . montelukast (SINGULAIR) tablet 10 mg  10 mg Oral QPM Arta Silence, MD   10 mg at 08/28/18 1641  . multivitamin with minerals tablet 1 tablet  1 tablet Oral Daily Mayo, Pete Pelt, MD   1 tablet at 08/29/18 1030  . ondansetron (ZOFRAN) tablet 4 mg  4 mg Oral Q6H PRN Arta Silence, MD       Or  . ondansetron (ZOFRAN) injection 4 mg  4 mg Intravenous Q6H PRN Arta Silence, MD   4 mg at 08/28/18 0155  . ondansetron (ZOFRAN) injection 4 mg  4 mg Intravenous Once Paulette Blanch, MD      . senna-docusate (Senokot-S) tablet 1 tablet  1 tablet Oral QHS PRN Arta Silence, MD      . sodium chloride flush (NS) 0.9 % injection 3 mL  3 mL Intravenous Once Arta Silence, MD      . tamsulosin (FLOMAX) capsule 0.4 mg  0.4 mg Oral Daily Arta Silence, MD   0.4 mg at 08/29/18 1030  . Vilazodone HCl (VIIBRYD) TABS 40 mg  40 mg Oral Daily Arta Silence, MD   40 mg at 08/29/18 1030      Allergies: No Known Allergies    Past Medical History: Past Medical History:  Diagnosis Date  . COPD (chronic obstructive pulmonary disease) ([place])   . Hypertension   . Parkinson disease ([place])   . Renal disorder      Past Surgical History: History reviewed. No pertinent surgical history.   Family History: History reviewed. No pertinent family history.   Social History: Social History   Socioeconomic History  . Marital status: Widowed    Spouse name: Not on file  . Number of children: Not on file  . Years of education: Not on file  . Highest education level: Not on file  Occupational History  . Not on file  Social Needs  .  Financial resource strain: Not on file   . Food insecurity:    Worry: Not on file    Inability: Not on file  . Transportation needs:    Medical: Not on file    Non-medical: Not on file  Tobacco Use  . Smoking status: Never Smoker  . Smokeless tobacco: Never Used  Substance and Sexual Activity  . Alcohol use: Not Currently    Frequency: Never  . Drug use: Never  . Sexual activity: Not on file  Lifestyle  . Physical activity:    Days per week: Not on file    Minutes per session: Not on file  . Stress: Not on file  Relationships  . Social connections:    Talks on phone: Not on file    Gets together: Not on file    Attends religious service: Not on file    Active member of club or organization: Not on file    Attends meetings of clubs or organizations: Not on file    Relationship status: Not on file  . Intimate partner violence:    Fear of current or ex partner: Not on file    Emotionally abused: Not on file    Physically abused: Not on file    Forced sexual activity: Not on file  Other Topics Concern  . Not on file  Social History Narrative  . Not on file     Review of Systems: Gen: No fevers or chills HEENT: No vision or hearing problems CV: shortness of breath at home, bilateral leg edema, left more than right Resp: No cough or sputum production GI: Appetite is fair GU : No problems with voiding MS: Leg weakness, occasional leg tremors Derm:  No complaints Psych: No complaints Heme: No complaints Neuro: No complaints Endocrine.  No complaints  Vital Signs: Blood pressure (!) 141/55, pulse 65, temperature 98.4 F (36.9 C), temperature source Oral, resp. rate 20, height 5\' 11"  (1.803 m), weight 133.2 kg, SpO2 97 %.   Intake/Output Summary (Last 24 hours) at 08/29/2018 1335 Last data filed at 08/29/2018 1026 Gross per 24 hour  Intake 240 ml  Output 400 ml  Net -160 ml    Weight trends: Filed Weights   08/27/18 2007 08/28/18 0900 08/29/18 0407  Weight: 132.2 kg 133.4 kg 133.2 kg     Physical Exam: General:  Sitting in the chair, no acute distress  HEENT  anicteric, moist oral mucous membranes  Neck:  Supple  Lungs:  Mild scattered rhonchi, no crackles  Heart::  No rub  Abdomen:  Soft, obese, nontender  Extremities:  2+ pitting edema on left, 1+ on right, bilateral thigh edema  Neurologic:  Alert, oriented  Skin:  No rashes    Lab results: Basic Metabolic Panel: Recent Labs  Lab 08/27/18 2015 08/28/18 0558 08/29/18 0358  NA 132* 136 136  K 5.0 5.0 5.4*  CL 93* 98 100  CO2 31 30 31   GLUCOSE 120* 110* 116*  BUN 66* 64* 76*  CREATININE 2.44* 2.30* 2.46*  CALCIUM 8.4* 8.5* 8.8*  MG 2.6*  --   --   PHOS 3.9  --   --     Liver Function Tests: Recent Labs  Lab 08/27/18 2015  AST 30  ALT 30  ALKPHOS 54  BILITOT 0.4  PROT 7.4  ALBUMIN 3.2*   No results for input(s): LIPASE, AMYLASE in the last 168 hours. No results for input(s): AMMONIA in the last 168 hours.  CBC: Recent Labs  Lab 08/27/18 2015 08/29/18 0358  WBC 12.4* 12.4*  HGB 9.0* 9.0*  HCT 28.8* 30.2*  MCV 95.7 99.0  PLT 287 326    Cardiac Enzymes: Recent Labs  Lab 08/28/18 0558  TROPONINI 0.06*    BNP: Invalid input(s): POCBNP  CBG: No results for input(s): GLUCAP in the last 168 hours.  Microbiology: No results found for this or any previous visit (from the past 720 hour(s)).   Coagulation Studies: No results for input(s): LABPROT, INR in the last 72 hours.  Urinalysis: Recent Labs    08/28/18 0025  COLORURINE STRAW*  LABSPEC 1.006  PHURINE 5.0  GLUCOSEU NEGATIVE  HGBUR SMALL*  BILIRUBINUR NEGATIVE  KETONESUR NEGATIVE  PROTEINUR NEGATIVE  NITRITE NEGATIVE  LEUKOCYTESUR LARGE*        Imaging: Dg Chest 2 View  Result Date: 08/27/2018 CLINICAL DATA:  Pt to ED by EMS. Per daughter pt became SOB and fell while walking to bathroom. Pt has hx of CHF and had recent Lasix dose change from 40 to 80 and has lost 20 lbs in 5 days. EMS states daughter  believes he may have had a seizure.*comment was truncated*SOB EXAM: CHEST - 2 VIEW COMPARISON:  06/23/2018 FINDINGS: Normal cardiac silhouette. Mild bibasilar atelectasis. Mild peripheral linear interstitial pattern increased from prior. No effusion, infiltrate or pneumothorax. No acute osseous abnormality. IMPRESSION: Mild basilar atelectasis. Mild interstitial edema pattern Electronically Signed   By: Suzy Bouchard M.D.   On: 08/27/2018 21:12   US Renal  Result Date: 08/28/2018 CLINICAL DATA:  Acute kidney injury EXAM: RENAL / URINARY TRACT ULTRASOUND COMPLETE COMPARISON:  None. FINDINGS: Right Kidney: Renal measurements: 10 x 6 x 5 cm. Cortical thinning and increased echogenicity. Upper pole cyst measuring 2.7 cm. No hydronephrosis Left Kidney: Renal measurements: 10 x 6 x 5 cm. Cortical thinning and increased echogenicity. No hydronephrosis. Bladder: Appears normal for degree of bladder distention. IMPRESSION: 1. Medical renal disease. 2. No hydronephrosis. Electronically Signed   By: Monte Fantasia M.D.   On: 08/28/2018 04:17      Assessment & Plan: Pt is a 78 y.o. Caucasian  male with hypertension, congestive heart failure, chronic kidney disease, was admitted on 08/27/2018 with shortness of breath  Chronic kidney disease stage IV Baseline creatinine is not available but assumed to be 2.3-2.4 with corresponding GFR 25-27 Underlying cause not clear but likely atherosclerosis and hypertension Renal ultrasound negative for hydronephrosis but does show medical renal disease Urinalysis shows small hemoglobin, 0-5 RBCs, WBC 21-50 We will obtain SPEP, UPEP  Lower extremity edema Follow salt restricted diet Asymmetric edema- lower extremity Doppler negative for DVT 2D echo results awaited.  Patient will eventually need diuretics, possibly torsemide Low normal blood pressure may be a limiting factor for diuresis  Mild hyperkalemia Agree with Kayexalate      LOS: 2 Sunya Humbarger 2/18/20201:35 PM  [place], [place]  Note: This note was prepared with Dragon dictation. Any transcription errors are unintentional

## 2018-08-29 NOTE — Care Management Note (Signed)
Case Management Note  Patient Details  Name: Alex Daniel MRN: [id number] Date of Birth: 07-Mar-1941  Subjective/Objective:   Patient is from home independent.  Lives in [place] but is staying with daughter Alex Daniel here in [place] since his wife passed 2 weeks ago.  He plans to stay with Alex Daniel for awhile but eventually return to his home in [place].  Admitted with edema and sob,  ECHO performed on 2-17.  Current with Dr Ervin Knack cardiologist in [place] and PCP, Dellia Nims.  Obtains medications at [facility] in [place] without difficulties.  He has a functioning scale at home and at his daughter's home.  He uses 2L chronic oxygen.  Will have portable available at DC.  Obtains oxygen through [facility].               Action/Plan:    Declining HH services at this time.  Will continue to assist with DC needs as needed and assit with progression of patient.     Expected Discharge Date:                  Expected Discharge Plan:  Home/Self Care  In-House Referral:     Discharge planning Services  CM Consult  Post Acute Care Choice:    Choice offered to:  Patient, Adult Children  DME Arranged:    DME Agency:     HH Arranged:  Patient Refused [facility] Agency:     Status of Service:  Completed, signed off  If discussed at [place] of Stay Meetings, dates discussed:    Additional Comments:  Elza Rafter, RN 08/29/2018, 8:42 AM

## 2018-08-29 NOTE — Progress Notes (Signed)
[place] [facility] [place] NAME: Alex Daniel    MR#:  [id number]  DATE OF BIRTH:  1940/10/23  SUBJECTIVE:   Patient states he is feeling about the same as yesterday.  He is still feeling short of breath, worse with exertion.  His lower extremity edema is unchanged.  He is urinating like normal.  No fevers or chills.  REVIEW OF SYSTEMS:  Review of Systems  Constitutional: Negative for chills and fever.  HENT: Negative for congestion and sore throat.   Eyes: Negative for blurred vision and double vision.  Respiratory: Positive for shortness of breath. Negative for cough.   Cardiovascular: Positive for leg swelling. Negative for chest pain.  Gastrointestinal: Negative for nausea and vomiting.  Genitourinary: Negative for dysuria and urgency.  Musculoskeletal: Negative for back pain and neck pain.  Neurological: Negative for dizziness and headaches.  Psychiatric/Behavioral: Negative for depression. The patient is not nervous/anxious.     DRUG ALLERGIES:  No Known Allergies VITALS:  Blood pressure (!) 141/55, pulse 65, temperature 98.4 F (36.9 C), temperature source Oral, resp. rate 20, height 5\' 11"  (1.803 m), weight 133.2 kg, SpO2 97 %. PHYSICAL EXAMINATION:  Physical Exam  Constitutional: Well-appearing, sitting up on edge of bed in no acute distress HEENT: Normocephalic, atraumatic, EOMI, no scleral icterus, moist mucous membranes  Neck: Supple, normal range of motion Cardiovascular: Irregularly irregular rhythm, regular rate, no murmur, rubs, gallops Respiratory:  Diminished breath sounds in the lung bases bilaterally, no wheezes or crackles, normal work of breathing, nasal cannula in place Gastrointestinal:+BS, soft, nondistended, nontender Musculoskeletal: 3+ edema to the knee bilaterally, no cyanosis or clubbing Neurologic:   CN II through XII grossly intact, no focal deficits, sensation intact throughout Skin:  Skin is warm, dry and  intact. No rash noted. Psychiatric: Mood and affect are normal. Speech and behavior are normal. LABORATORY PANEL:  Male CBC Recent Labs  Lab 08/29/18 0358  WBC 12.4*  HGB 9.0*  HCT 30.2*  PLT 326   ------------------------------------------------------------------------------------------------------------------ Chemistries  Recent Labs  Lab 08/27/18 2015  08/29/18 0358  NA 132*   < > 136  K 5.0   < > 5.4*  CL 93*   < > 100  CO2 31   < > 31  GLUCOSE 120*   < > 116*  BUN 66*   < > 76*  CREATININE 2.44*   < > 2.46*  CALCIUM 8.4*   < > 8.8*  MG 2.6*  --   --   AST 30  --   --   ALT 30  --   --   ALKPHOS 54  --   --   BILITOT 0.4  --   --    < > = values in this interval not displayed.   RADIOLOGY:  Dg Chest 1 View  Result Date: 08/29/2018 CLINICAL DATA:  78 year old male with a history of shortness of breath EXAM: CHEST  1 VIEW COMPARISON:  08/27/2018 FINDINGS: Cardiomediastinal silhouette unchanged in size and contour with cardiomegaly. Interlobular septal thickening with reticulonodular opacities bilateral lungs. No pneumothorax. Blunting of the costophrenic angles compatible with pleural effusion. No displaced fracture IMPRESSION: Similar appearance of low lung volumes and reticulonodular opacity of the lungs, potentially combination of edema, infection, atelectasis/scarring, and small pleural effusions. Electronically Signed   By: Corrie Mckusick D.O.   On: 08/29/2018 13:40   ASSESSMENT AND PLAN:   Bilateral lower extremity edema- possibly secondary to acute CHF exacerbation.  Chest x-ray does show some mild interstitial edema.  BNP 228.  No previous echo available.  May also be a component of chronic venous insufficiency. -Echo pending -TED hose ordered -Advised patient to elevate legs as much as possible  AKI in ?CKD 3- possibly due to intravascular depletion.  Received albumin plus saline, followed by Lasix in the ED. Creatinine worsening.  Unclear baseline. -Will  consult nephrology today -Renal US with medical renal disease -Avoid nephrotoxic agents -Monitor  Hyperkalemia- due to above -Given Kayexalate x1 overnight   Elevated troponin- likely secondary to demand ischemia.  Troponins mildly elevated and flat no active chest pain. -Monitor  Chronic hypoxic respiratory failure secondary to COPD-on 2 L O2 at home.  No signs of acute COPD exacerbation. -Continue supplemental oxygen  Chronic A. fib- rate controlled -Continue Eliquis and metoprolol  Normocytic anemia- hemoglobin 9.0.  No previous values for comparison. -Anemia panel unremarkable -Monitor  Depression-stable -Continue vilazodone and Wellbutrin  All the records are reviewed and case discussed with Care Management/Social Worker. Management plans discussed with the patient, family and they are in agreement.  CODE STATUS: Full Code  TOTAL TIME TAKING CARE OF THIS PATIENT: 40 minutes.   More than 50% of the time was spent in counseling/coordination of care: YES  POSSIBLE D/C IN 1-2 DAYS, DEPENDING ON CLINICAL CONDITION.   Berna Spare Mayo M.D on 08/29/2018 at 2:41 PM  Between 7am to 6pm - Pager - [phone]  After 6pm go to [email or web address] - Proofreader  [facility]  Office  [phone]  CC: Primary care physician; System, Pcp Not In  Note: This dictation was prepared with Dragon dictation along with smaller phrase technology. Any transcriptional errors that result from this process are unintentional.

## 2018-08-29 NOTE — Progress Notes (Addendum)
Patient asking for a cough drop or a piece of candy.  This RN text-paged the MD on call for cough drop order.   34:  MD ordered tessalon and guaifenesin-codeine.  Patient and family made aware.

## 2018-08-29 NOTE — Progress Notes (Addendum)
This RN text paged the on call MD to notify of increased lab values, most notable potassium (5.4), BUN (76), and creatinine (2.46), among others.    3329: On call MD ordered kayexelate to address the pt's potassium.  This RN has decided to give the patient tessalon for his cough first to minimize any accidents.  Will give kayexelate after tessalon has had time to kick in, approximately 15-20 minutes after administration (according to [employer]).

## 2018-08-30 ENCOUNTER — Encounter: Payer: Self-pay | Admitting: Nurse Practitioner

## 2018-08-30 DIAGNOSIS — N189 Chronic kidney disease, unspecified: Secondary | ICD-10-CM

## 2018-08-30 LAB — BASIC METABOLIC PANEL
Anion gap: 8 (ref 5–15)
BUN: 76 mg/dL — ABNORMAL HIGH (ref 8–23)
CO2: 30 mmol/L (ref 22–32)
Calcium: 8.7 mg/dL — ABNORMAL LOW (ref 8.9–10.3)
Chloride: 101 mmol/L (ref 98–111)
Creatinine, Ser: 2.11 mg/dL — ABNORMAL HIGH (ref 0.61–1.24)
GFR calc Af Amer: 34 mL/min — ABNORMAL LOW (ref >60)
GFR calc non Af Amer: 30 mL/min — ABNORMAL LOW (ref >60)
Glucose, Bld: 240 mg/dL — ABNORMAL HIGH (ref 70–99)
POTASSIUM: 5.4 mmol/L — AB (ref 3.5–5.1)
Sodium: 139 mmol/L (ref 135–145)

## 2018-08-30 LAB — CBC
HCT: 27.1 % — ABNORMAL LOW (ref 39.0–52.0)
HEMOGLOBIN: 8.1 g/dL — AB (ref 13.0–17.0)
MCH: 29.8 pg (ref 26.0–34.0)
MCHC: 29.9 g/dL — ABNORMAL LOW (ref 30.0–36.0)
MCV: 99.6 fL (ref 80.0–100.0)
Platelets: 290 10*3/uL (ref 150–400)
RBC: 2.72 MIL/uL — ABNORMAL LOW (ref 4.22–5.81)
RDW: 15.1 % (ref 11.5–15.5)
WBC: 10.3 10*3/uL (ref 4.0–10.5)
nRBC: 0 % (ref 0.0–0.2)

## 2018-08-30 LAB — PROCALCITONIN: Procalcitonin: 0.47 ng/mL

## 2018-08-30 MED ORDER — ATORVASTATIN CALCIUM 20 MG PO TABS
20.0000 mg | ORAL_TABLET | Freq: Every day | ORAL | Status: DC
  Administered 2018-08-31 – 2018-09-02 (×3): 20 mg via ORAL
  Filled 2018-08-30 (×3): qty 1

## 2018-08-30 MED ORDER — PATIROMER SORBITEX CALCIUM 8.4 G PO PACK
8.4000 g | PACK | Freq: Every day | ORAL | Status: DC
  Administered 2018-08-30 – 2018-09-02 (×4): 8.4 g via ORAL
  Filled 2018-08-30 (×4): qty 1

## 2018-08-30 MED ORDER — FUROSEMIDE 10 MG/ML IJ SOLN
40.0000 mg | Freq: Two times a day (BID) | INTRAMUSCULAR | Status: DC
  Administered 2018-08-30 (×2): 40 mg via INTRAVENOUS
  Filled 2018-08-30 (×2): qty 4

## 2018-08-30 MED ORDER — HYDRALAZINE HCL 10 MG PO TABS
10.0000 mg | ORAL_TABLET | Freq: Three times a day (TID) | ORAL | Status: DC
  Administered 2018-08-30 – 2018-09-02 (×10): 10 mg via ORAL
  Filled 2018-08-30 (×14): qty 1

## 2018-08-30 MED ORDER — ISOSORBIDE MONONITRATE ER 30 MG PO TB24
15.0000 mg | ORAL_TABLET | Freq: Every day | ORAL | Status: DC
  Administered 2018-08-30 – 2018-09-03 (×5): 15 mg via ORAL
  Filled 2018-08-30 (×5): qty 1

## 2018-08-30 NOTE — Consult Note (Signed)
[facility], [place] 08/30/18  Subjective:   Patient feels well today.  No acute complaints.  Continues to have large amount of lower extremity edema.  Getting IV furosemide with good response.  Urine output not recorded.  Objective:  Vital signs in last 24 hours:  Temp:  [97.4 F (36.3 C)-98.3 F (36.8 C)] 98.3 F (36.8 C) (02/19 1620) Pulse Rate:  [73-89] 89 (02/19 1620) Resp:  [18-21] 18 (02/19 1620) BP: (128-143)/(61-69) 143/61 (02/19 1620) SpO2:  [97 %-100 %] 100 % (02/19 1620) Weight:  [132.3 kg] 132.3 kg (02/19 0406)  Weight change: -1.043 kg Filed Weights   08/28/18 0900 08/29/18 0407 08/30/18 0406  Weight: 133.4 kg 133.2 kg 132.3 kg    Intake/Output:    Intake/Output Summary (Last 24 hours) at 08/30/2018 1649 Last data filed at 08/30/2018 1644 Gross per 24 hour  Intake 240 ml  Output 0 ml  Net 240 ml     Physical Exam: General:  Sitting in the chair, no acute distress  HEENT  anicteric, moist oral mucous membranes  Neck:  Supple  Lungs:  Mild scattered rhonchi, no crackles  Heart::  No rub  Abdomen:  Soft, obese, nontender  Extremities:  2+ pitting edema on left, 1+ on right, bilateral thigh edema  Neurologic:  Alert, oriented  Skin:  No rashes     Basic Metabolic Panel:  Recent Labs  Lab 08/27/18 2015 08/28/18 0558 08/29/18 0358 08/30/18 0942  NA 132* 136 136 139  K 5.0 5.0 5.4* 5.4*  CL 93* 98 100 101  CO2 31 30 31 30   GLUCOSE 120* 110* 116* 240*  BUN 66* 64* 76* 76*  CREATININE 2.44* 2.30* 2.46* 2.11*  CALCIUM 8.4* 8.5* 8.8* 8.7*  MG 2.6*  --   --   --   PHOS 3.9  --   --   --      CBC: Recent Labs  Lab 08/27/18 2015 08/29/18 0358 08/30/18 0942  WBC 12.4* 12.4* 10.3  HGB 9.0* 9.0* 8.1*  HCT 28.8* 30.2* 27.1*  MCV 95.7 99.0 99.6  PLT 287 326 290     No results found for: HEPBSAG, HEPBSAB, HEPBIGM    Microbiology:  No results found for this or any previous visit (from the past 240  hour(s)).  Coagulation Studies: No results for input(s): LABPROT, INR in the last 72 hours.  Urinalysis: Recent Labs    08/28/18 0025  COLORURINE STRAW*  LABSPEC 1.006  PHURINE 5.0  GLUCOSEU NEGATIVE  HGBUR SMALL*  BILIRUBINUR NEGATIVE  KETONESUR NEGATIVE  PROTEINUR NEGATIVE  NITRITE NEGATIVE  LEUKOCYTESUR LARGE*      Imaging: Dg Chest 1 View  Result Date: 08/29/2018 CLINICAL DATA:  78 year old male with a history of shortness of breath EXAM: CHEST  1 VIEW COMPARISON:  08/27/2018 FINDINGS: Cardiomediastinal silhouette unchanged in size and contour with cardiomegaly. Interlobular septal thickening with reticulonodular opacities bilateral lungs. No pneumothorax. Blunting of the costophrenic angles compatible with pleural effusion. No displaced fracture IMPRESSION: Similar appearance of low lung volumes and reticulonodular opacity of the lungs, potentially combination of edema, infection, atelectasis/scarring, and small pleural effusions. Electronically Signed   By: Corrie Mckusick D.O.   On: 08/29/2018 13:40   US Venous Img Lower Unilateral Left  Result Date: 08/29/2018 CLINICAL DATA:  Edema x3 weeks EXAM: LEFT LOWER EXTREMITY VENOUS DOPPLER ULTRASOUND TECHNIQUE: Gray-scale sonography with compression, as well as color and duplex ultrasound, were performed to evaluate the deep venous system from the level of the  common femoral vein through the popliteal and proximal calf veins. COMPARISON:  None FINDINGS: Normal compressibility of the common femoral, superficial femoral, and popliteal veins, as well as the proximal calf veins. No filling defects to suggest DVT on grayscale or color Doppler imaging. Doppler waveforms show normal direction of venous flow, normal respiratory phasicity and response to augmentation. Visualized segments of the saphenous venous system normal in caliber and compressibility. Survey views of the contralateral common femoral vein are unremarkable. IMPRESSION: No  femoropopliteal and no calf DVT in the visualized calf veins. If clinical symptoms are inconsistent or if there are persistent or worsening symptoms, further imaging (possibly involving the iliac veins) may be warranted. Electronically Signed   By: Lucrezia Europe M.D.   On: 08/29/2018 15:54     Medications:    . apixaban  5 mg Oral BID  . aspirin  81 mg Oral Daily  . buPROPion  300 mg Oral Daily  . carbamazepine  100 mg Oral BID  . clonazePAM  0.5 mg Oral QHS  . feeding supplement (ENSURE ENLIVE)  237 mL Oral BID BM  . furosemide  40 mg Intravenous BID  . hydrALAZINE  10 mg Oral Q8H  . isosorbide mononitrate  15 mg Oral Daily  . mouth rinse  15 mL Mouth Rinse BID  . metoprolol succinate  50 mg Oral Daily  . montelukast  10 mg Oral QPM  . multivitamin with minerals  1 tablet Oral Daily  . ondansetron (ZOFRAN) IV  4 mg Intravenous Once  . patiromer  8.4 g Oral Daily  . sodium chloride flush  3 mL Intravenous Once  . tamsulosin  0.4 mg Oral Daily  . Vilazodone HCl  40 mg Oral Daily   acetaminophen **OR** acetaminophen, albuterol, benzonatate, bisacodyl, guaiFENesin-codeine, ondansetron **OR** ondansetron (ZOFRAN) IV, senna-docusate  Assessment/ Plan:  78 y.o. male with hypertension, congestive heart failure, chronic kidney disease, was admitted on 08/27/2018 with shortness of breath  Chronic kidney disease stage IV Baseline creatinine is not available but assumed to be 2.3-2.4 with corresponding GFR 25-27 Underlying cause not clear but likely atherosclerosis and hypertension Renal ultrasound negative for hydronephrosis but does show medical renal disease Urinalysis shows small hemoglobin, 0-5 RBCs, WBC 21-50 Outpatient records have been requested from primary care  Lower extremity edema Follow salt restricted diet Asymmetric edema- lower extremity Doppler negative for DVT 2D echo shows moderately reduced LV systolic function with EF 35 to 40% Agree with trial of IV  furosemide  Mild hyperkalemia Agree with Kayexalate   LOS: [place] 2/19/20204:49 PM  [place], [place]  Note: This note was prepared with Dragon dictation. Any transcription errors are unintentional

## 2018-08-30 NOTE — Consult Note (Signed)
Cardiology Consult    Patient ID: Alex Daniel MRN: [id number], DOB/AGE: 1941/06/01   Admit date: 08/27/2018 Date of Consult: 08/30/2018  Primary Physician: System, Pcp Not In Primary Cardiologist: Nelva Bush, MD - new - prev followed by Shelle Iron, MD, in [place], [place]. Requesting Provider: None  Patient Profile    Alex Daniel is a 78 y.o. male with a history of chronic kidney disease, COPD, obesity, hypertension, atrial fibrillation, Parkinson's disease, and heart failure, who is being seen today for the evaluation of volume overload/heart failure and atrial fibrillation at the request of Dr. Brett Albino.  Past Medical History   Past Medical History:  Diagnosis Date  . CKD (chronic kidney disease), stage IV ([facility])   . COPD (chronic obstructive pulmonary disease) ([place])   . HFrEF (heart failure with reduced ejection fraction) ([place])    a. 08/2018 Echo: EF 35-40%, mid-apical anteroseptal and apical AK. Mildly dil RV/LA, mild to mod TR. RA pressure ~ 16mmHg.  [place] [facility] Hypertension   . Morbid obesity ([place])   . Normocytic anemia   . PAF (paroxysmal atrial fibrillation) ([facility])    a. CHA2DS2VASc = 4-->Eliquis.  . Parkinson disease [facility])     History reviewed. No pertinent surgical history.   Allergies  No Known Allergies  History of Present Illness    78 year old male with the above complex past medical history including chronic kidney disease, COPD, obesity, hypertension, atrial fibrillation, Parkinson's, and heart failure.  He was previously followed in [place], [place], in [place] and at this time we do not have those records.  He has a known prior history of A. fib and has been anticoagulated with Eliquis.  He reports remote cardioversion but is unsure if he maintains sinus rhythm or has since developed persistent/permanent A. fib.  Dating back to the spring 2019, he was experiencing worsening dyspnea and was seen in the emergency department at [facility]  and was diagnosed with seasonal allergies and COPD exacerbation.  He was treated with steroids and discharged from the emergency department.  When he followed up with his primary care provider in [place], he was reportedly noted to be volume overloaded.  His daughter says he was given a shot of something and then had a 30 pound weight loss over a period of about a week.  At some point along the way, he was seen by his cardiologist and had an echocardiogram which as far as his daughter knows, was normal.  Patient does not routinely weigh himself at home but is unaware of any recent weight loss or weight gain.  He unfortunately lost his wife approximately 3 weeks ago following a long battle with cancer.  He has since been staying with his daughter in [place].  Following this loss, he has been eating more processed and fast foods.  Over the past 3 weeks, his family has noted progressively worsening dyspnea on exertion and he has noted worsening left greater than right lower extremity edema.  He sleeps in a recliner chronically.  He has not had any chest pain.  On February 16, in the setting of significant dyspnea and weakness, he was having trouble transferring to the bathroom while using his walker and he lost his balance and fell.  He did not seem to lose consciousness and there was no significant trauma.  Because of this episode, his family became scared and brought him into the emergency department.  Here, his chest x-ray showed mild interstitial edema.  ECG showed rate controlled atrial fibrillation.  BNP was mildly elevated at 228.  Troponin was mildly elevated 0.07.  Creatinine was elevated at 2.44, though previous baseline is unknown.  Patient was admitted and given 1 dose of Lasix on the day of admission.  Since then, he has had persistent dyspnea with minimal exertion and also evidence of volume overload.  A follow-up chest x-ray on February 18 continues to show interstitial edema.  His troponin has  since trended to 0.062.  He has remained in rate controlled atrial fibrillation and denies symptoms of palpitations.  Echocardiogram has been performed and shows an EF of 35 to 40%.  Inpatient Medications    . apixaban  5 mg Oral BID  . aspirin  81 mg Oral Daily  . buPROPion  300 mg Oral Daily  . carbamazepine  100 mg Oral BID  . clonazePAM  0.5 mg Oral QHS  . feeding supplement (ENSURE ENLIVE)  237 mL Oral BID BM  . furosemide  40 mg Intravenous BID  . mouth rinse  15 mL Mouth Rinse BID  . metoprolol succinate  50 mg Oral Daily  . montelukast  10 mg Oral QPM  . multivitamin with minerals  1 tablet Oral Daily  . ondansetron (ZOFRAN) IV  4 mg Intravenous Once  . sodium chloride flush  3 mL Intravenous Once  . tamsulosin  0.4 mg Oral Daily  . Vilazodone HCl  40 mg Oral Daily    Family History    History reviewed. No pertinent family history. He indicated that his mother is deceased. He indicated that his father is deceased.  Social History    Social History   Socioeconomic History  . Marital status: Widowed    Spouse name: Not on file  . Number of children: Not on file  . Years of education: Not on file  . Highest education level: Not on file  Occupational History  . Not on file  Social Needs  . Financial resource strain: Not on file  . Food insecurity:    Worry: Not on file    Inability: Not on file  . Transportation needs:    Medical: Not on file    Non-medical: Not on file  Tobacco Use  . Smoking status: Never Smoker  . Smokeless tobacco: Never Used  Substance and Sexual Activity  . Alcohol use: Not Currently    Frequency: Never  . Drug use: Never  . Sexual activity: Not on file  Lifestyle  . Physical activity:    Days per week: Not on file    Minutes per session: Not on file  . Stress: Not on file  Relationships  . Social connections:    Talks on phone: Not on file    Gets together: Not on file    Attends religious service: Not on file    Active  member of club or organization: Not on file    Attends meetings of clubs or organizations: Not on file    Relationship status: Not on file  . Intimate partner violence:    Fear of current or ex partner: Not on file    Emotionally abused: Not on file    Physically abused: Not on file    Forced sexual activity: Not on file  Other Topics Concern  . Not on file  Social History Narrative   From [place], [place] but currently staying with daughter in [place].  Retired.  Does not routinely exercise.     Review of Systems    General:  No  chills, fever, night sweats or weight changes.  Cardiovascular:  No chest pain, +++ dyspnea on minimal exertion, +++ left greater than right LE edema, +++ chronic orthopnea-sleeps in recliner, no palpitations, paroxysmal nocturnal dyspnea. Dermatological: No rash, lesions/masses Respiratory: No cough, +++ dyspnea Urologic: No hematuria, dysuria Abdominal:   No nausea, vomiting, diarrhea, bright red blood per rectum, melena, or hematemesis Neurologic:  No visual changes, wkns, changes in mental status. All other systems reviewed and are otherwise negative except as noted above.  Physical Exam    Blood pressure 136/65, pulse 78, temperature (!) 97.4 F (36.3 C), temperature source Oral, resp. rate 20, height 5\' 11"  (1.803 m), weight 132.3 kg, SpO2 97 %.  General: Pleasant, NAD Psych: Normal affect. Neuro: Alert and oriented X 3. Moves all extremities spontaneously. HEENT: Normal  Neck: Supple without bruits.  Difficult to gauge JVP secondary to girth. Lungs:  Resp regular and unlabored, diminished breath sounds at bilateral bases with crackles and mild diffuse expiratory wheezing. Heart: Irregularly irregular no s3, s4, or murmurs. Abdomen: Soft, non-tender, non-distended, BS + x 4.  Extremities: No clubbing, cyanosis.  1+ right lower extremity edema, 2+ left lower extremity edema.   DP/PT/Radials 2+ and equal bilaterally.  Labs    Troponin  Recent Labs      08/27/18 2015 08/28/18 0057 08/28/18 0558  TROPONINI 0.07* 0.06* 0.06*   Lab Results  Component Value Date   WBC 10.3 08/30/2018   HGB 8.1 (L) 08/30/2018   HCT 27.1 (L) 08/30/2018   MCV 99.6 08/30/2018   PLT 290 08/30/2018    Recent Labs  Lab 08/27/18 2015  08/30/18 0942  NA 132*   < > 139  K 5.0   < > 5.4*  CL 93*   < > 101  CO2 31   < > 30  BUN 66*   < > 76*  CREATININE 2.44*   < > 2.11*  CALCIUM 8.4*   < > 8.7*  PROT 7.4  --   --   BILITOT 0.4  --   --   ALKPHOS 54  --   --   ALT 30  --   --   AST 30  --   --   GLUCOSE 120*   < > 240*   < > = values in this interval not displayed.     Radiology Studies    Dg Chest 1 View  Result Date: 08/29/2018 CLINICAL DATA:  78 year old male with a history of shortness of breath EXAM: CHEST  1 VIEW COMPARISON:  08/27/2018 FINDINGS: Cardiomediastinal silhouette unchanged in size and contour with cardiomegaly. Interlobular septal thickening with reticulonodular opacities bilateral lungs. No pneumothorax. Blunting of the costophrenic angles compatible with pleural effusion. No displaced fracture IMPRESSION: Similar appearance of low lung volumes and reticulonodular opacity of the lungs, potentially combination of edema, infection, atelectasis/scarring, and small pleural effusions. Electronically Signed   By: Corrie Mckusick D.O.   On: 08/29/2018 13:40   Dg Chest 2 View  Result Date: 08/27/2018 CLINICAL DATA:  Pt to ED by EMS. Per daughter pt became SOB and fell while walking to bathroom. Pt has hx of CHF and had recent Lasix dose change from 40 to 80 and has lost 20 lbs in 5 days. EMS states daughter believes he may have had a seizure.*comment was truncated*SOB EXAM: CHEST - 2 VIEW COMPARISON:  06/23/2018 FINDINGS: Normal cardiac silhouette. Mild bibasilar atelectasis. Mild peripheral linear interstitial pattern increased from prior. No effusion, infiltrate or pneumothorax. No  acute osseous abnormality. IMPRESSION: Mild basilar  atelectasis. Mild interstitial edema pattern Electronically Signed   By: Suzy Bouchard M.D.   On: 08/27/2018 21:12   US Renal  Result Date: 08/28/2018 CLINICAL DATA:  Acute kidney injury EXAM: RENAL / URINARY TRACT ULTRASOUND COMPLETE COMPARISON:  None. FINDINGS: Right Kidney: Renal measurements: 10 x 6 x 5 cm. Cortical thinning and increased echogenicity. Upper pole cyst measuring 2.7 cm. No hydronephrosis Left Kidney: Renal measurements: 10 x 6 x 5 cm. Cortical thinning and increased echogenicity. No hydronephrosis. Bladder: Appears normal for degree of bladder distention. IMPRESSION: 1. Medical renal disease. 2. No hydronephrosis. Electronically Signed   By: Monte Fantasia M.D.   On: 08/28/2018 04:17   US Venous Img Lower Unilateral Left  Result Date: 08/29/2018 CLINICAL DATA:  Edema x3 weeks EXAM: LEFT LOWER EXTREMITY VENOUS DOPPLER ULTRASOUND TECHNIQUE: Gray-scale sonography with compression, as well as color and duplex ultrasound, were performed to evaluate the deep venous system from the level of the common femoral vein through the popliteal and proximal calf veins. COMPARISON:  None FINDINGS: Normal compressibility of the common femoral, superficial femoral, and popliteal veins, as well as the proximal calf veins. No filling defects to suggest DVT on grayscale or color Doppler imaging. Doppler waveforms show normal direction of venous flow, normal respiratory phasicity and response to augmentation. Visualized segments of the saphenous venous system normal in caliber and compressibility. Survey views of the contralateral common femoral vein are unremarkable. IMPRESSION: No femoropopliteal and no calf DVT in the visualized calf veins. If clinical symptoms are inconsistent or if there are persistent or worsening symptoms, further imaging (possibly involving the iliac veins) may be warranted. Electronically Signed   By: Lucrezia Europe M.D.   On: 08/29/2018 15:54    ECG & Cardiac Imaging    Atrial  fibrillation, 88, no acute ST or T changes- personally reviewed.  Assessment & Plan    1.  Acute systolic congestive heart failure: Patient with reported prior history of volume excess in the spring 2019.  Family reports an echocardiogram was performed at that time it was reportedly normal.  Over the past 3 weeks, he has noted increasing lower extremity edema as well as dyspnea with minimal exertion.  He was admitted February 16 due to worsening symptoms.  He was found to be in rate controlled atrial fibrillation on admission with chest x-ray showing interstitial edema.  Echocardiogram this admission shows LV dysfunction with an EF of 35 to 40% and an anteroseptal and apical wall motion abnormality.  ECG is without acute ST or T changes.  He has markedly volume overload on examination.  He has chronic kidney disease with unknown baseline creatinine.  We will add Lasix 40 mg IV twice daily and arrange for follow-up basic metabolic panel in the morning.  Continue Toprol-XL and I will add low-dose hydralazine/nitrate.  We will reach out to his former primary cardiologist for records today.  If LV dysfunction is new, we will likely need to consider diagnostic catheterization, understanding of course that renal failure may be prohibitive.  2.  Atrial fibrillation: Not clear if this is paroxysmal or persistent/permanent.  Denies palpitations.  Will need to obtain records from former cardiologist in [place].  If it appears that A. fib is paroxysmal, we will need to consider a rhythm control strategy to potentially include cardioversion plus or minus antiarrhythmic therapy.  He is chronically anticoagulated with Eliquis 5 mg twice daily and denies missing any doses.  Continue beta-blocker.  3.  Elevated troponin: Mild troponin elevation in the setting of volume overload.  Trend is flat.  He denies any history of chest pain.  Suspect demand ischemia in the setting of volume overload though cannot say for sure that  he does not have ischemic heart disease.  As above, obtain records from prior cardiologist and consider ischemic testing.  Continue beta-blocker.  I will discontinue aspirin in the setting of long-term Eliquis therapy.  Follow-up lipids with a low threshold to add statin.  4.  Chronic kidney disease: Baseline creatinine unknown.  He was 2.44 in the emergency department and 2.11 today.  He has been seen by nephrology.  We are adding Lasix in the setting of volume overload.  We will have to continue to watch renal function closely.  5.  Hyperkalemia: Treated with Kayexalate yesterday.  Follow electrolytes closely in the setting of diuretic therapy.  6.  Normocytic anemia: In the setting of chronic kidney disease.  We will try to get outside records.  Signed, Murray Hodgkins, NP 08/30/2018, 1:31 PM  For questions or updates, please contact   Please consult [email or web address] for contact info under Cardiology/STEMI.

## 2018-08-30 NOTE — Progress Notes (Addendum)
[facility] [place] NAME: Alex Daniel    MR#:  [id number]  DATE OF BIRTH:  1941/02/06  SUBJECTIVE:   Patient states he is doing fine this morning.  He feels like his shortness of breath has improved a little bit.  He also feels like his lower extremity edema is improving.  REVIEW OF SYSTEMS:  Review of Systems  Constitutional: Negative for chills and fever.  HENT: Negative for congestion and sore throat.   Eyes: Negative for blurred vision and double vision.  Respiratory: Positive for shortness of breath. Negative for cough.   Cardiovascular: Positive for leg swelling. Negative for chest pain.  Gastrointestinal: Negative for nausea and vomiting.  Genitourinary: Negative for dysuria and urgency.  Musculoskeletal: Negative for back pain and neck pain.  Neurological: Negative for dizziness and headaches.  Psychiatric/Behavioral: Negative for depression. The patient is not nervous/anxious.     DRUG ALLERGIES:  No Known Allergies VITALS:  Blood pressure 136/65, pulse 78, temperature (!) 97.4 F (36.3 C), temperature source Oral, resp. rate 20, height 5\' 11"  (1.803 m), weight 132.3 kg, SpO2 97 %. PHYSICAL EXAMINATION:  Physical Exam  Constitutional: Well-appearing, sitting up on edge of bed in no acute distress HEENT: Normocephalic, atraumatic, EOMI, no scleral icterus, moist mucous membranes  Neck: Supple, normal range of motion Cardiovascular: Irregularly irregular rhythm, regular rate, no murmur, rubs, gallops Respiratory:  Diminished breath sounds in the lung bases bilaterally, no wheezes or crackles, normal work of breathing, nasal cannula in place Gastrointestinal:+BS, soft, nondistended, nontender Musculoskeletal: 2+ edema to the knee bilaterally L>R, no cyanosis or clubbing Neurologic:   CN II through XII grossly intact, no focal deficits, sensation intact throughout Skin:  Skin is warm, dry and intact. No rash  noted. Psychiatric: Mood and affect are normal. Speech and behavior are normal. LABORATORY PANEL:  Male CBC Recent Labs  Lab 08/30/18 0942  WBC 10.3  HGB 8.1*  HCT 27.1*  PLT 290   ------------------------------------------------------------------------------------------------------------------ Chemistries  Recent Labs  Lab 08/27/18 2015  08/30/18 0942  NA 132*   < > 139  K 5.0   < > 5.4*  CL 93*   < > 101  CO2 31   < > 30  GLUCOSE 120*   < > 240*  BUN 66*   < > 76*  CREATININE 2.44*   < > 2.11*  CALCIUM 8.4*   < > 8.7*  MG 2.6*  --   --   AST 30  --   --   ALT 30  --   --   ALKPHOS 54  --   --   BILITOT 0.4  --   --    < > = values in this interval not displayed.   RADIOLOGY:  US Venous Img Lower Unilateral Left  Result Date: 08/29/2018 CLINICAL DATA:  Edema x3 weeks EXAM: LEFT LOWER EXTREMITY VENOUS DOPPLER ULTRASOUND TECHNIQUE: Gray-scale sonography with compression, as well as color and duplex ultrasound, were performed to evaluate the deep venous system from the level of the common femoral vein through the popliteal and proximal calf veins. COMPARISON:  None FINDINGS: Normal compressibility of the common femoral, superficial femoral, and popliteal veins, as well as the proximal calf veins. No filling defects to suggest DVT on grayscale or color Doppler imaging. Doppler waveforms show normal direction of venous flow, normal respiratory phasicity and response to augmentation. Visualized segments of the saphenous venous system normal in caliber and compressibility. Survey views  of the contralateral common femoral vein are unremarkable. IMPRESSION: No femoropopliteal and no calf DVT in the visualized calf veins. If clinical symptoms are inconsistent or if there are persistent or worsening symptoms, further imaging (possibly involving the iliac veins) may be warranted. Electronically Signed   By: Lucrezia Europe M.D.   On: 08/29/2018 15:54   ASSESSMENT AND PLAN:   Acute on chronic  systolic congestive heart failure- daughter states he has a diagnosis of CHF, but she does not know if it is diastolic or systolic.  Lower extremity edema mildly improved. -Cardiology consulted- starting Lasix 40 mg twice daily, low-dose hydralazine, low-dose Imdur; may need diagnostic catheterization if LV dysfunction is new -Continue metoprolol -Unable to start ACE/ARB due to renal insufficiency -Echo with EF 35-40% -TED hose ordered -Advised patient to elevate legs as much as possible  AKI in ?CKD 3- likely with some component of cardiorenal syndrome.  Creatinine improved today. -Nephrology following- SPEP and UPEP pending -Renal US with medical renal disease -Avoid nephrotoxic agents -Monitor  Hyperkalemia- K 5.4 again today -Veltassa x1   Elevated troponin- likely secondary to demand ischemia.  Troponins mildly elevated and flat no active chest pain. -Monitor  Chronic hypoxic respiratory failure secondary to COPD-on 2 L O2 at home.  No signs of acute COPD exacerbation. -Continue supplemental oxygen  Chronic A. fib- rate controlled -Continue Eliquis and metoprolol  Normocytic anemia- hemoglobin 8-9.  No previous values for comparison. -Anemia panel unremarkable -Monitor  Depression-stable -Continue vilazodone and Wellbutrin  All the records are reviewed and case discussed with Care Management/Social Worker. Management plans discussed with the patient, family and they are in agreement.  CODE STATUS: Full Code  TOTAL TIME TAKING CARE OF THIS PATIENT: 35 minutes.   More than 50% of the time was spent in counseling/coordination of care: YES  POSSIBLE D/C IN 1-2 DAYS, DEPENDING ON CLINICAL CONDITION.   Berna Spare Rosana Farnell M.D on 08/30/2018 at 2:02 PM  Between 7am to 6pm - Pager - [phone]  After 6pm go to [email or web address] - Proofreader  [facility]  Office  [phone]  CC: Primary care physician; System, Pcp Not In  Note: This  dictation was prepared with Dragon dictation along with smaller phrase technology. Any transcriptional errors that result from this process are unintentional.

## 2018-08-31 ENCOUNTER — Encounter: Payer: Self-pay | Admitting: *Deleted

## 2018-08-31 ENCOUNTER — Inpatient Hospital Stay: Payer: Medicare Other

## 2018-08-31 LAB — CBC
HEMATOCRIT: 25.8 % — AB (ref 39.0–52.0)
Hemoglobin: 7.7 g/dL — ABNORMAL LOW (ref 13.0–17.0)
MCH: 29.3 pg (ref 26.0–34.0)
MCHC: 29.8 g/dL — ABNORMAL LOW (ref 30.0–36.0)
MCV: 98.1 fL (ref 80.0–100.0)
Platelets: 306 10*3/uL (ref 150–400)
RBC: 2.63 MIL/uL — ABNORMAL LOW (ref 4.22–5.81)
RDW: 15.1 % (ref 11.5–15.5)
WBC: 11.3 10*3/uL — ABNORMAL HIGH (ref 4.0–10.5)
nRBC: 0 % (ref 0.0–0.2)

## 2018-08-31 LAB — BASIC METABOLIC PANEL
Anion gap: 7 (ref 5–15)
BUN: 73 mg/dL — ABNORMAL HIGH (ref 8–23)
CO2: 32 mmol/L (ref 22–32)
Calcium: 8.5 mg/dL — ABNORMAL LOW (ref 8.9–10.3)
Chloride: 101 mmol/L (ref 98–111)
Creatinine, Ser: 1.98 mg/dL — ABNORMAL HIGH (ref 0.61–1.24)
GFR calc non Af Amer: 32 mL/min — ABNORMAL LOW (ref >60)
GFR, EST AFRICAN AMERICAN: 37 mL/min — AB (ref >60)
Glucose, Bld: 144 mg/dL — ABNORMAL HIGH (ref 70–99)
Potassium: 4.5 mmol/L (ref 3.5–5.1)
Sodium: 140 mmol/L (ref 135–145)

## 2018-08-31 LAB — PREPARE RBC (CROSSMATCH)

## 2018-08-31 LAB — ABO/RH: ABO/RH(D): B POS

## 2018-08-31 LAB — GLUCOSE, CAPILLARY: Glucose-Capillary: 134 mg/dL — ABNORMAL HIGH (ref 70–99)

## 2018-08-31 LAB — LIPID PANEL
Cholesterol: 115 mg/dL (ref 0–200)
HDL: 37 mg/dL — ABNORMAL LOW (ref >40)
LDL Cholesterol: 68 mg/dL (ref 0–99)
Total CHOL/HDL Ratio: 3.1 RATIO
Triglycerides: 52 mg/dL (ref <150)
VLDL: 10 mg/dL (ref 0–40)

## 2018-08-31 MED ORDER — SODIUM CHLORIDE 0.9% IV SOLUTION: Freq: Once | INTRAVENOUS | Status: DC

## 2018-08-31 MED ORDER — FUROSEMIDE 10 MG/ML IJ SOLN
60.0000 mg | Freq: Two times a day (BID) | INTRAMUSCULAR | Status: DC
  Administered 2018-08-31 – 2018-09-01 (×3): 60 mg via INTRAVENOUS
  Filled 2018-08-31 (×3): qty 6

## 2018-08-31 MED ORDER — GUAIFENESIN ER 600 MG PO TB12
600.0000 mg | ORAL_TABLET | Freq: Two times a day (BID) | ORAL | Status: DC | PRN
  Administered 2018-09-01 – 2018-09-02 (×2): 600 mg via ORAL
  Filled 2018-08-31 (×2): qty 1

## 2018-08-31 NOTE — Progress Notes (Addendum)
[facility] [place] NAME: Leodan Bolyard    MR#:  [id number]  DATE OF BIRTH:  1941-01-24  SUBJECTIVE:   Patient states that his breathing has improved this morning.  He has noted that he is much less short of breath when walking from the bathroom to the bed.  He feels like his lower extremity edema continues to improve.  He is urinating a lot with the Lasix.  REVIEW OF SYSTEMS:  Review of Systems  Constitutional: Negative for chills and fever.  HENT: Negative for congestion and sore throat.   Eyes: Negative for blurred vision and double vision.  Respiratory: Positive for shortness of breath. Negative for cough.   Cardiovascular: Positive for leg swelling. Negative for chest pain.  Gastrointestinal: Negative for nausea and vomiting.  Genitourinary: Negative for dysuria and urgency.  Musculoskeletal: Negative for back pain and neck pain.  Neurological: Negative for dizziness and headaches.  Psychiatric/Behavioral: Negative for depression. The patient is not nervous/anxious.     DRUG ALLERGIES:  No Known Allergies VITALS:  Blood pressure (!) 126/57, pulse 87, temperature 98.4 F (36.9 C), temperature source Oral, resp. rate 16, height 5\' 11"  (1.803 m), weight 131.5 kg, SpO2 97 %. PHYSICAL EXAMINATION:  Physical Exam  Constitutional: Well-appearing, sitting up on edge of bed in no acute distress HEENT: Normocephalic, atraumatic, EOMI, no scleral icterus, moist mucous membranes  Neck: Supple, normal range of motion Cardiovascular: Irregularly irregular rhythm, regular rate, no murmur, rubs, gallops Respiratory:  Diminished breath sounds in the lung bases bilaterally, no wheezes or crackles, normal work of breathing, nasal cannula in place Gastrointestinal:+BS, soft, nondistended, nontender Musculoskeletal: 2+ edema to the midshin bilaterally L>R, no cyanosis or clubbing Neurologic:   CN II through XII grossly intact, no focal deficits,  sensation intact throughout Skin:  Skin is warm, dry and intact. No rash noted. Psychiatric: Mood and affect are normal. Speech and behavior are normal. LABORATORY PANEL:  Male CBC Recent Labs  Lab 08/31/18 0334  WBC 11.3*  HGB 7.7*  HCT 25.8*  PLT 306   ------------------------------------------------------------------------------------------------------------------ Chemistries  Recent Labs  Lab 08/27/18 2015  08/31/18 0334  NA 132*   < > 140  K 5.0   < > 4.5  CL 93*   < > 101  CO2 31   < > 32  GLUCOSE 120*   < > 144*  BUN 66*   < > 73*  CREATININE 2.44*   < > 1.98*  CALCIUM 8.4*   < > 8.5*  MG 2.6*  --   --   AST 30  --   --   ALT 30  --   --   ALKPHOS 54  --   --   BILITOT 0.4  --   --    < > = values in this interval not displayed.   RADIOLOGY:  Dg Chest 1 View  Result Date: 08/31/2018 CLINICAL DATA:  Shortness of breath. Parkinson's disease. EXAM: CHEST  1 VIEW COMPARISON:  08/29/2018. FINDINGS: Low lung volumes. Cardiomegaly. Moderate vascular congestion, with BILATERAL RIGHT greater than LEFT pleural effusions. Early pulmonary edema not excluded. IMPRESSION: Cardiomegaly with vascular congestion and BILATERAL effusions. Early pulmonary edema not excluded. For Electronically Signed   By: Staci Righter M.D.   On: 08/31/2018 09:02   ASSESSMENT AND PLAN:   Acute on chronic systolic congestive heart failure- dyspnea on exertion and lower extremity edema improving -Cardiology consulted- increase Lasix to 60 mg twice daily,  continue low-dose hydralazine, low-dose Imdur; may need diagnostic catheterization if LV dysfunction is new -Continue metoprolol -Unable to start ACE/ARB due to renal insufficiency -Echo with EF 35-40% -TED hose ordered -Advised patient to elevate legs as much as possible  AKI in ?CKD 3- creatinine improved today -Nephrology following -Renal US with medical renal disease -Avoid nephrotoxic agents -Monitor   Elevated troponin- likely  secondary to demand ischemia.  Troponins mildly elevated and flat no active chest pain. -Monitor  Chronic hypoxic respiratory failure secondary to COPD-on 2 L O2 at home.  No signs of acute COPD exacerbation. -Continue supplemental oxygen  Chronic A. fib- rate controlled -Continue Eliquis and metoprolol  Normocytic anemia- hemoglobin 7.7 today. -Will give 1 unit PRBC, given that transfusion threshold is <8 -Anemia panel unremarkable -FOBT pending -Monitor  Depression-stable -Continue vilazodone and Wellbutrin  All the records are reviewed and case discussed with Care Management/Social Worker. Management plans discussed with the patient, family and they are in agreement.  CODE STATUS: Full Code  TOTAL TIME TAKING CARE OF THIS PATIENT: 35 minutes.   More than 50% of the time was spent in counseling/coordination of care: YES  POSSIBLE D/C IN 1-2 DAYS, DEPENDING ON CLINICAL CONDITION.   Berna Spare Micah Barnier M.D on 08/31/2018 at 6:46 PM  Between 7am to 6pm - Pager [phone]  After 6pm go to [email or web address] - Proofreader  [facility]  Office  [phone]  CC: Primary care physician; System, Pcp Not In  Note: This dictation was prepared with Dragon dictation along with smaller phrase technology. Any transcriptional errors that result from this process are unintentional.

## 2018-08-31 NOTE — Care Management Important Message (Signed)
Copy of signed Medicare IM left with patient in room. 

## 2018-08-31 NOTE — Consult Note (Signed)
[place], [place] 08/31/18  Subjective:   Patient feels well today.  No acute complaints.  Continues to have large amount of lower extremity edema.  Getting IV furosemide with good response.  Urine output recorded at 900 cc Slight improvement in serum creatinine noted today  Objective:  Vital signs in last 24 hours:  Temp:  [97.6 F (36.4 C)-98.4 F (36.9 C)] 98.4 F (36.9 C) (02/20 1718) Pulse Rate:  [81-111] 87 (02/20 1718) Resp:  [16-19] 16 (02/20 1539) BP: (109-139)/(53-70) 126/57 (02/20 1718) SpO2:  [96 %-100 %] 97 % (02/20 1718) Weight:  [131.5 kg] 131.5 kg (02/20 0242)  Weight change: -0.816 kg Filed Weights   08/29/18 0407 08/30/18 0406 08/31/18 0242  Weight: 133.2 kg 132.3 kg 131.5 kg    Intake/Output:    Intake/Output Summary (Last 24 hours) at 08/31/2018 1746 Last data filed at 08/31/2018 1704 Gross per 24 hour  Intake 800 ml  Output 1000 ml  Net -200 ml     Physical Exam: General:  Sitting in the chair, no acute distress  HEENT  anicteric, moist oral mucous membranes  Neck:  Supple  Lungs:  Mild scattered rhonchi, no crackles  Heart::  No rub  Abdomen:  Soft, obese, nontender  Extremities:  2+ pitting edema on left, 1+ on right, bilateral thigh edema  Neurologic:  Alert, oriented  Skin:  No rashes     Basic Metabolic Panel:  Recent Labs  Lab 08/27/18 2015 08/28/18 0558 08/29/18 0358 08/30/18 0942 08/31/18 0334  NA 132* 136 136 139 140  K 5.0 5.0 5.4* 5.4* 4.5  CL 93* 98 100 101 101  CO2 31 30 31 30  32  GLUCOSE 120* 110* 116* 240* 144*  BUN 66* 64* 76* 76* 73*  CREATININE 2.44* 2.30* 2.46* 2.11* 1.98*  CALCIUM 8.4* 8.5* 8.8* 8.7* 8.5*  MG 2.6*  --   --   --   --   PHOS 3.9  --   --   --   --      CBC: Recent Labs  Lab 08/27/18 2015 08/29/18 0358 08/30/18 0942 08/31/18 0334  WBC 12.4* 12.4* 10.3 11.3*  HGB 9.0* 9.0* 8.1* 7.7*  HCT 28.8* 30.2* 27.1* 25.8*  MCV 95.7 99.0 99.6 98.1  PLT 287 326 290  306     No results found for: HEPBSAG, HEPBSAB, HEPBIGM    Microbiology:  No results found for this or any previous visit (from the past 240 hour(s)).  Coagulation Studies: No results for input(s): LABPROT, INR in the last 72 hours.  Urinalysis: No results for input(s): COLORURINE, LABSPEC, PHURINE, GLUCOSEU, HGBUR, BILIRUBINUR, KETONESUR, PROTEINUR, UROBILINOGEN, NITRITE, LEUKOCYTESUR in the last 72 hours.  Invalid input(s): APPERANCEUR    Imaging: Dg Chest 1 View  Result Date: 08/31/2018 CLINICAL DATA:  Shortness of breath. Parkinson's disease. EXAM: CHEST  1 VIEW COMPARISON:  08/29/2018. FINDINGS: Low lung volumes. Cardiomegaly. Moderate vascular congestion, with BILATERAL RIGHT greater than LEFT pleural effusions. Early pulmonary edema not excluded. IMPRESSION: Cardiomegaly with vascular congestion and BILATERAL effusions. Early pulmonary edema not excluded. For Electronically Signed   By: Staci Righter M.D.   On: 08/31/2018 09:02     Medications:    . sodium chloride   Intravenous Once  . apixaban  5 mg Oral BID  . atorvastatin  20 mg Oral q1800  . buPROPion  300 mg Oral Daily  . carbamazepine  100 mg Oral BID  . clonazePAM  0.5 mg Oral QHS  .  feeding supplement (ENSURE ENLIVE)  237 mL Oral BID BM  . furosemide  60 mg Intravenous BID  . hydrALAZINE  10 mg Oral Q8H  . isosorbide mononitrate  15 mg Oral Daily  . mouth rinse  15 mL Mouth Rinse BID  . metoprolol succinate  50 mg Oral Daily  . montelukast  10 mg Oral QPM  . multivitamin with minerals  1 tablet Oral Daily  . ondansetron (ZOFRAN) IV  4 mg Intravenous Once  . patiromer  8.4 g Oral Daily  . sodium chloride flush  3 mL Intravenous Once  . tamsulosin  0.4 mg Oral Daily  . Vilazodone HCl  40 mg Oral Daily   acetaminophen **OR** acetaminophen, albuterol, benzonatate, bisacodyl, guaiFENesin-codeine, ondansetron **OR** ondansetron (ZOFRAN) IV, senna-docusate  Assessment/ Plan:  78 y.o. male with  hypertension, congestive heart failure, chronic kidney disease, was admitted on 08/27/2018 with shortness of breath  Chronic kidney disease stage 3 Outpatient records from PMD office were reviewed.  Patient's GFR has declined over the last year from 64 back in April 2019 to 31/32 more recently in February 2020 Chronic kidney disease is likely secondary to likely atherosclerosis and hypertension Renal ultrasound negative for hydronephrosis but does show medical renal disease Urinalysis shows small hemoglobin, 0-5 RBCs, WBC 21-50  Lower extremity edema Follow salt restricted diet Asymmetric edema- lower extremity Doppler negative for DVT 2D echo shows moderately reduced LV systolic function with EF 35 to 40% Agree with trial of IV furosemide  Mild hyperkalemia Agree with maintenance of Veltassa  Cardiac- acute on chronic systolic CHF, EF 35 to 96%, atrial fibrillation.  Echo 08/28/2018 Followed by [facility] cardiology   LOS: [place] 2/20/20205:46 PM  [place], [place]  Note: This note was prepared with Dragon dictation. Any transcription errors are unintentional

## 2018-08-31 NOTE — Progress Notes (Signed)
Progress Note  Patient Name: Alex Daniel Date of Encounter: 08/31/2018  Primary Cardiologist: Nelva Bush, MD  Subjective   Breathing improved.  Now feels like he can get good deep breaths and has been coughing up some clear sputum.  Feels that he has had good response to lasix thus far.  Renal fxn stable.  Inpatient Medications    Scheduled Meds: . apixaban  5 mg Oral BID  . atorvastatin  20 mg Oral q1800  . buPROPion  300 mg Oral Daily  . carbamazepine  100 mg Oral BID  . clonazePAM  0.5 mg Oral QHS  . feeding supplement (ENSURE ENLIVE)  237 mL Oral BID BM  . furosemide  60 mg Intravenous BID  . hydrALAZINE  10 mg Oral Q8H  . isosorbide mononitrate  15 mg Oral Daily  . mouth rinse  15 mL Mouth Rinse BID  . metoprolol succinate  50 mg Oral Daily  . montelukast  10 mg Oral QPM  . multivitamin with minerals  1 tablet Oral Daily  . ondansetron (ZOFRAN) IV  4 mg Intravenous Once  . patiromer  8.4 g Oral Daily  . sodium chloride flush  3 mL Intravenous Once  . tamsulosin  0.4 mg Oral Daily  . Vilazodone HCl  40 mg Oral Daily   Continuous Infusions:  PRN Meds: acetaminophen **OR** acetaminophen, albuterol, benzonatate, bisacodyl, guaiFENesin-codeine, ondansetron **OR** ondansetron (ZOFRAN) IV, senna-docusate   Vital Signs    Vitals:   08/31/18 0218 08/31/18 0242 08/31/18 0448 08/31/18 0741  BP: 120/65  110/61 126/69  Pulse: (!) 101  81 84  Resp: 18  16 16   Temp:   97.7 F (36.5 C) 97.7 F (36.5 C)  TempSrc:   Oral Oral  SpO2: 97%  100% 100%  Weight:  131.5 kg    Height:        Intake/Output Summary (Last 24 hours) at 08/31/2018 0830 Last data filed at 08/31/2018 0215 Gross per 24 hour  Intake 480 ml  Output 900 ml  Net -420 ml   Filed Weights   08/29/18 0407 08/30/18 0406 08/31/18 0242  Weight: 133.2 kg 132.3 kg 131.5 kg    Physical Exam   GEN: obese, in no acute distress.  HEENT: Grossly normal.  Neck: Supple, JVD to jaw, no carotid bruits, or  masses. Cardiac: IR, IR, distant heart sounds, no murmurs, rubs, or gallops. No clubbing, cyanosis, 1+ RLE and 2+ LLE edema.  Radials/DP/PT 2+ and equal bilaterally.  Respiratory:  Respirations regular and unlabored, diminished breath sounds bilaterally. GI: Obese, firm, nontender.  MIld flank edema.  BS + x 4. MS: no deformity or atrophy. Skin: warm and dry, no rash. Neuro:  Strength and sensation are intact. Psych: AAOx3.  Normal affect.  Labs    Chemistry Recent Labs  Lab 08/27/18 2015  08/29/18 0358 08/30/18 0942 08/31/18 0334  NA 132*   < > 136 139 140  K 5.0   < > 5.4* 5.4* 4.5  CL 93*   < > 100 101 101  CO2 31   < > 31 30 32  GLUCOSE 120*   < > 116* 240* 144*  BUN 66*   < > 76* 76* 73*  CREATININE 2.44*   < > 2.46* 2.11* 1.98*  CALCIUM 8.4*   < > 8.8* 8.7* 8.5*  PROT 7.4  --   --   --   --   ALBUMIN 3.2*  --   --   --   --  AST 30  --   --   --   --   ALT 30  --   --   --   --   ALKPHOS 54  --   --   --   --   BILITOT 0.4  --   --   --   --   GFRNONAA 25*   < > 25* 30* 32*  GFRAA 29*   < > 29* 34* 37*  ANIONGAP 8   < > 5 8 7    < > = values in this interval not displayed.     Hematology Recent Labs  Lab 08/29/18 0358 08/30/18 0942 08/31/18 0334  WBC 12.4* 10.3 11.3*  RBC 3.05*  3.05* 2.72* 2.63*  HGB 9.0* 8.1* 7.7*  HCT 30.2* 27.1* 25.8*  MCV 99.0 99.6 98.1  MCH 29.5 29.8 29.3  MCHC 29.8* 29.9* 29.8*  RDW 14.8 15.1 15.1  PLT 326 290 306    Cardiac Enzymes Recent Labs  Lab 08/27/18 2015 08/28/18 0057 08/28/18 0558  TROPONINI 0.07* 0.06* 0.06*      BNP Recent Labs  Lab 08/27/18 2015  BNP 228.0*      Radiology    Dg Chest 1 View  Result Date: 08/29/2018 CLINICAL DATA:  78 year old male with a history of shortness of breath EXAM: CHEST  1 VIEW COMPARISON:  08/27/2018 FINDINGS: Cardiomediastinal silhouette unchanged in size and contour with cardiomegaly. Interlobular septal thickening with reticulonodular opacities bilateral lungs. No  pneumothorax. Blunting of the costophrenic angles compatible with pleural effusion. No displaced fracture IMPRESSION: Similar appearance of low lung volumes and reticulonodular opacity of the lungs, potentially combination of edema, infection, atelectasis/scarring, and small pleural effusions. Electronically Signed   By: Corrie Mckusick D.O.   On: 08/29/2018 13:40   US Venous Img Lower Unilateral Left  Result Date: 08/29/2018 CLINICAL DATA:  Edema x3 weeks EXAM: LEFT LOWER EXTREMITY VENOUS DOPPLER ULTRASOUND TECHNIQUE: Gray-scale sonography with compression, as well as color and duplex ultrasound, were performed to evaluate the deep venous system from the level of the common femoral vein through the popliteal and proximal calf veins. COMPARISON:  None FINDINGS: Normal compressibility of the common femoral, superficial femoral, and popliteal veins, as well as the proximal calf veins. No filling defects to suggest DVT on grayscale or color Doppler imaging. Doppler waveforms show normal direction of venous flow, normal respiratory phasicity and response to augmentation. Visualized segments of the saphenous venous system normal in caliber and compressibility. Survey views of the contralateral common femoral vein are unremarkable. IMPRESSION: No femoropopliteal and no calf DVT in the visualized calf veins. If clinical symptoms are inconsistent or if there are persistent or worsening symptoms, further imaging (possibly involving the iliac veins) may be warranted. Electronically Signed   By: Lucrezia Europe M.D.   On: 08/29/2018 15:54    Telemetry    Afib, 70's - Personally Reviewed  Cardiac Studies   2D Echocardiogram 2.17.2020   1. The left ventricle has moderately reduced systolic function, with an ejection fraction of 35-40%. The cavity size was normal. Left ventricular diastology could not be evaluated secondary to atrial fibrillation.  2. The right ventricle has normal systolic function. The cavity was  mildly enlarged. There is mildly increased right ventricular wall thickness.  3. Left atrial size was mildly dilated.  4. The mitral valve was not well visualized. Mild thickening of the mitral valve leaflet.  5. The tricuspid valve is not well visualized. Tricuspid valve regurgitation is mild-moderate.  6.  The aortic valve was not well visualized Moderate thickening of the aortic valve Moderate calcification of the aortic valve.  7. The aortic root is normal in size and structure.  8. The inferior vena cava was dilated in size with <50% respiratory variability.  9. Right atrial pressure is estimated at 15 mmHg. 10. There is akinesis of the mid-apical anteroseptal and apical left ventricular segments. _____________    Patient Profile     78 y.o. male with a history of chronic kidney disease, COPD, obesity, hypertension, atrial fibrillation, Parkinson's disease, and heart failure, who was admitted 2/16 with progressive dyspnea, heart failure, and afib.  Assessment & Plan    1.  Acute systolic CHF (chronicity unknown):  Admitted 2/16 w/ a three wk h/o progressive dyspnea, edema, and weakness.  CXR w/ interstitial edema and markedly volume overloaded on admission.  Also in afib - chronicity unclear.  Echo this admission w/ EF 35-40%.  We started lasix 40 IV bid and he responded well.  Minus 423ml overnight and -1.3 since admission.  Wt 133.4  131.5 since admission.  He notes good response to lasix yesterday though remains markedly volume overloaded.  Creat has come down slightly from 2.11 to 1.98 this AM.  We have escalated lasix dosing to 60mg  bid this AM.  Cont  blocker, hydral/nitrate.  No MRA/ACEI/ARB/ARNI 2/2 CKD.  2.  Afib:  Chronicity unclear.  Awaiting records from former cardiologist in [place], [place].  He is well rate-controlled on  blocker therapy.  If records indicate that he usually maintains sinus rhythm, we will need to strongly consider DCCV +/- AAD once volume improved.  He remains  anticoagulated w/ Eliquis 5mg  BID.    3.  Elevated tropnin:  Mild elevation w/ flat trend in setting of volume overload.  Suspect demand ischemia.  Awaiting prior records.  Cont  blocker.  ASA d/c's in setting of chronic eliquis and anemia.  Will need to consider ischemic testing in the future with preference to avoid cath in setting of CKD and pt preference.  4. CKD:  Creat sl better this AM w/ diuresis yesterday.  Cont to follow.  Nephrology on board.  5.  Hyperkalemia:  K nl this AM.  6.  Normocytic anemia:  H/H down this AM  7.7/25.8.  Likely 2/2 CKD and blood draws.  He denies any h/o melena/brbpr/hematuria.  Guaiac stools.  Signed, Murray Hodgkins, NP  08/31/2018, 8:30 AM    For questions or updates, please contact   Please consult [email or web address] for contact info under Cardiology/STEMI.

## 2018-08-31 NOTE — Plan of Care (Signed)
  Problem: Clinical Measurements: Goal: Ability to maintain clinical measurements within normal limits will improve Outcome: Progressing Note:  Hemoglobin value = only 7.7 today. One unit of PRBC's ordered today. Already infusing. Will continue to monitor lab values. Alex Daniel [facility]

## 2018-09-01 DIAGNOSIS — R0602 Shortness of breath: Secondary | ICD-10-CM

## 2018-09-01 DIAGNOSIS — I4819 Other persistent atrial fibrillation: Secondary | ICD-10-CM

## 2018-09-01 DIAGNOSIS — D649 Anemia, unspecified: Secondary | ICD-10-CM

## 2018-09-01 DIAGNOSIS — N179 Acute kidney failure, unspecified: Secondary | ICD-10-CM

## 2018-09-01 DIAGNOSIS — I420 Dilated cardiomyopathy: Secondary | ICD-10-CM

## 2018-09-01 LAB — BPAM RBC
Blood Product Expiration Date: 202003132359
ISSUE DATE / TIME: 202002201322
Unit Type and Rh: 7300

## 2018-09-01 LAB — TYPE AND SCREEN
ABO/RH(D): B POS
Antibody Screen: NEGATIVE
Unit division: 0

## 2018-09-01 LAB — CBC
HCT: 31.3 % — ABNORMAL LOW (ref 39.0–52.0)
Hemoglobin: 9.6 g/dL — ABNORMAL LOW (ref 13.0–17.0)
MCH: 30.1 pg (ref 26.0–34.0)
MCHC: 30.7 g/dL (ref 30.0–36.0)
MCV: 98.1 fL (ref 80.0–100.0)
Platelets: 352 10*3/uL (ref 150–400)
RBC: 3.19 MIL/uL — ABNORMAL LOW (ref 4.22–5.81)
RDW: 16.3 % — ABNORMAL HIGH (ref 11.5–15.5)
WBC: 12.5 10*3/uL — ABNORMAL HIGH (ref 4.0–10.5)
nRBC: 0 % (ref 0.0–0.2)

## 2018-09-01 LAB — BASIC METABOLIC PANEL
Anion gap: 5 (ref 5–15)
BUN: 73 mg/dL — ABNORMAL HIGH (ref 8–23)
CO2: 37 mmol/L — ABNORMAL HIGH (ref 22–32)
Calcium: 8.9 mg/dL (ref 8.9–10.3)
Chloride: 98 mmol/L (ref 98–111)
Creatinine, Ser: 2.11 mg/dL — ABNORMAL HIGH (ref 0.61–1.24)
GFR calc Af Amer: 34 mL/min — ABNORMAL LOW (ref >60)
GFR calc non Af Amer: 30 mL/min — ABNORMAL LOW (ref >60)
Glucose, Bld: 119 mg/dL — ABNORMAL HIGH (ref 70–99)
Potassium: 5.4 mmol/L — ABNORMAL HIGH (ref 3.5–5.1)
Sodium: 140 mmol/L (ref 135–145)

## 2018-09-01 MED ORDER — FUROSEMIDE 10 MG/ML IJ SOLN
40.0000 mg | Freq: Two times a day (BID) | INTRAMUSCULAR | Status: AC
  Administered 2018-09-01 – 2018-09-02 (×3): 40 mg via INTRAVENOUS
  Filled 2018-09-01 (×3): qty 4

## 2018-09-01 MED ORDER — FLEET ENEMA 7-19 GM/118ML RE ENEM
1.0000 | ENEMA | Freq: Once | RECTAL | Status: AC
  Administered 2018-09-01: 1 via RECTAL

## 2018-09-01 MED ORDER — PATIROMER SORBITEX CALCIUM 8.4 G PO PACK: 8.4000 g | PACK | Freq: Every day | ORAL | Status: DC

## 2018-09-01 NOTE — Progress Notes (Signed)
Pt complaining of congestion in chest and unable to cough it up, MD paged, Dr. Jannifer Franklin put in orders for mucinex & guaifenesin cough syrup as needed, will give and continue to monitor. Conley Simmonds, RN, BSN

## 2018-09-01 NOTE — Consult Note (Signed)
[place], [place] 09/01/18  Subjective:   Patient feels well today.  No acute complaints.  Continues to have large amount of lower extremity edema.  Getting IV furosemide with good response.  Urine output recorded at >1900 cc.  S Creatinine noted to have increased slightly Lasix dose reduced by Cardiology team  Objective:  Vital signs in last 24 hours:  Temp:  [97.6 F (36.4 C)-98.4 F (36.9 C)] 97.7 F (36.5 C) (02/21 0834) Pulse Rate:  [82-111] 94 (02/21 0834) Resp:  [16-20] 20 (02/21 0500) BP: (120-157)/(53-102) 142/78 (02/21 0834) SpO2:  [97 %-100 %] 100 % (02/21 0834) Weight:  [131.5 kg] 131.5 kg (02/21 0651)  Weight change: -0.045 kg Filed Weights   08/30/18 0406 08/31/18 0242 09/01/18 0651  Weight: 132.3 kg 131.5 kg 131.5 kg    Intake/Output:    Intake/Output Summary (Last 24 hours) at 09/01/2018 1434 Last data filed at 09/01/2018 1133 Gross per 24 hour  Intake 650 ml  Output 2400 ml  Net -1750 ml     Physical Exam: General:  Sitting in the chair, no acute distress  HEENT  anicteric, moist oral mucous membranes  Neck:  Supple  Lungs:  Mild scattered rhonchi, no crackles  Heart::  No rub  Abdomen:  Soft, obese, nontender  Extremities:  2+ pitting edema on left, 1+ on right, bilateral thigh edema  Neurologic:  Alert, oriented  Skin:  No rashes     Basic Metabolic Panel:  Recent Labs  Lab 08/27/18 2015 08/28/18 0558 08/29/18 0358 08/30/18 0942 08/31/18 0334 09/01/18 0703  NA 132* 136 136 139 140 140  K 5.0 5.0 5.4* 5.4* 4.5 5.4*  CL 93* 98 100 101 101 98  CO2 31 30 31 30  32 37*  GLUCOSE 120* 110* 116* 240* 144* 119*  BUN 66* 64* 76* 76* 73* 73*  CREATININE 2.44* 2.30* 2.46* 2.11* 1.98* 2.11*  CALCIUM 8.4* 8.5* 8.8* 8.7* 8.5* 8.9  MG 2.6*  --   --   --   --   --   PHOS 3.9  --   --   --   --   --      CBC: Recent Labs  Lab 08/27/18 2015 08/29/18 0358 08/30/18 0942 08/31/18 0334 09/01/18 0703  WBC 12.4*  12.4* 10.3 11.3* 12.5*  HGB 9.0* 9.0* 8.1* 7.7* 9.6*  HCT 28.8* 30.2* 27.1* 25.8* 31.3*  MCV 95.7 99.0 99.6 98.1 98.1  PLT 287 326 290 306 352     No results found for: HEPBSAG, HEPBSAB, HEPBIGM    Microbiology:  No results found for this or any previous visit (from the past 240 hour(s)).  Coagulation Studies: No results for input(s): LABPROT, INR in the last 72 hours.  Urinalysis: No results for input(s): COLORURINE, LABSPEC, PHURINE, GLUCOSEU, HGBUR, BILIRUBINUR, KETONESUR, PROTEINUR, UROBILINOGEN, NITRITE, LEUKOCYTESUR in the last 72 hours.  Invalid input(s): APPERANCEUR    Imaging: Dg Chest 1 View  Result Date: 08/31/2018 CLINICAL DATA:  Shortness of breath. Parkinson's disease. EXAM: CHEST  1 VIEW COMPARISON:  08/29/2018. FINDINGS: Low lung volumes. Cardiomegaly. Moderate vascular congestion, with BILATERAL RIGHT greater than LEFT pleural effusions. Early pulmonary edema not excluded. IMPRESSION: Cardiomegaly with vascular congestion and BILATERAL effusions. Early pulmonary edema not excluded. For Electronically Signed   By: Staci Righter M.D.   On: 08/31/2018 09:02     Medications:    . sodium chloride   Intravenous Once  . apixaban  5 mg Oral BID  . atorvastatin  20 mg Oral q1800  . buPROPion  300 mg Oral Daily  . carbamazepine  100 mg Oral BID  . clonazePAM  0.5 mg Oral QHS  . feeding supplement (ENSURE ENLIVE)  237 mL Oral BID BM  . furosemide  40 mg Intravenous BID  . hydrALAZINE  10 mg Oral Q8H  . isosorbide mononitrate  15 mg Oral Daily  . mouth rinse  15 mL Mouth Rinse BID  . metoprolol succinate  50 mg Oral Daily  . montelukast  10 mg Oral QPM  . multivitamin with minerals  1 tablet Oral Daily  . ondansetron (ZOFRAN) IV  4 mg Intravenous Once  . patiromer  8.4 g Oral Daily  . sodium chloride flush  3 mL Intravenous Once  . tamsulosin  0.4 mg Oral Daily  . Vilazodone HCl  40 mg Oral Daily   acetaminophen **OR** acetaminophen, albuterol, benzonatate,  bisacodyl, guaiFENesin, ondansetron **OR** ondansetron (ZOFRAN) IV, senna-docusate  Assessment/ Plan:  78 y.o. male with hypertension, congestive heart failure, chronic kidney disease, was admitted on 08/27/2018 with shortness of breath  Chronic kidney disease stage 3 Outpatient records from PMD office were reviewed.  Patient's GFR has declined over the last year from 84 back in April 2019 to 31/32 more recently in February 2020 Chronic kidney disease is likely secondary to likely atherosclerosis and hypertension Renal ultrasound negative for hydronephrosis but does show medical renal disease Urinalysis shows small hemoglobin, 0-5 RBCs, WBC 21-50 Fluctuating renal function due to volume shifts with diuresis  Lower extremity edema Follow salt restricted diet Asymmetric edema- lower extremity Doppler negative for DVT 2D echo (08/28/18)  shows moderately reduced LV systolic function with EF 35 to 40% Agree with trial of IV furosemide 288 lbs (131 kg today)  Mild hyperkalemia Agree with maintenance of [place]  Cardiac- acute on chronic systolic CHF, EF 35 to 76%, atrial fibrillation.  Echo 08/28/2018 Followed by [facility] cardiology   LOS: [place] 2/21/20202:34 PM  [place], [place]  Note: This note was prepared with Dragon dictation. Any transcription errors are unintentional

## 2018-09-01 NOTE — Plan of Care (Signed)
  Problem: Activity: Goal: Capacity to carry out activities will improve 09/01/2018 1734 by Daron Offer, RN Outcome: Progressing Note:  Patient has been ambulating around the room better today and expressed to myself and the cardiologist he is continuing to feel better following the administration of IV Lasix BID. Will continue to monitor mobility progression. Wenda Low [facility]

## 2018-09-01 NOTE — Care Management Important Message (Signed)
Copy of signed Medicare IM left with patient in room. 

## 2018-09-01 NOTE — Progress Notes (Addendum)
Progress Note  Patient Name: Alex Daniel Date of Encounter: 09/01/2018  Primary Cardiologist: Nelva Bush, MD  Subjective   Breathing cont to improve, though still with lower ext edema and dyspnea w/ ambulation.  He says he's getting tired of being here.  No chest pain, presyncope, palpitations.  Inpatient Medications    Scheduled Meds: . sodium chloride   Intravenous Once  . apixaban  5 mg Oral BID  . atorvastatin  20 mg Oral q1800  . buPROPion  300 mg Oral Daily  . carbamazepine  100 mg Oral BID  . clonazePAM  0.5 mg Oral QHS  . feeding supplement (ENSURE ENLIVE)  237 mL Oral BID BM  . furosemide  60 mg Intravenous BID  . hydrALAZINE  10 mg Oral Q8H  . isosorbide mononitrate  15 mg Oral Daily  . mouth rinse  15 mL Mouth Rinse BID  . metoprolol succinate  50 mg Oral Daily  . montelukast  10 mg Oral QPM  . multivitamin with minerals  1 tablet Oral Daily  . ondansetron (ZOFRAN) IV  4 mg Intravenous Once  . patiromer  8.4 g Oral Daily  . sodium chloride flush  3 mL Intravenous Once  . tamsulosin  0.4 mg Oral Daily  . Vilazodone HCl  40 mg Oral Daily   Continuous Infusions:  PRN Meds: acetaminophen **OR** acetaminophen, albuterol, benzonatate, bisacodyl, guaiFENesin, ondansetron **OR** ondansetron (ZOFRAN) IV, senna-docusate   Vital Signs    Vitals:   08/31/18 2011 09/01/18 0500 09/01/18 0651 09/01/18 0834  BP: 137/68 (!) 157/62  (!) 142/78  Pulse: 87 82  94  Resp:  20    Temp:  97.9 F (36.6 C)  97.7 F (36.5 C)  TempSrc:  Oral    SpO2:  97%  100%  Weight:   131.5 kg   Height:        Intake/Output Summary (Last 24 hours) at 09/01/2018 0847 Last data filed at 09/01/2018 8119 Gross per 24 hour  Intake 800 ml  Output 2050 ml  Net -1250 ml   Filed Weights   08/30/18 0406 08/31/18 0242 09/01/18 0651  Weight: 132.3 kg 131.5 kg 131.5 kg    Physical Exam   GEN: Obese, in no acute distress.  HEENT: Grossly normal.  Neck: Supple, JVD to jaw, no carotid  bruits, or masses. Cardiac: IR, IR, distant, no murmurs, rubs, or gallops. No clubbing, cyanosis. 1+ RLE, 2+ LLE edema.  Radials/DP/PT 2+ and equal bilaterally.  Respiratory:  Respirations regular and unlabored, diminished breath sounds @ right base - improved from yesterday. GI: firm, protuberant, nontender.  MIld flank edema.  BS + x 4. MS: no deformity or atrophy. Skin: warm and dry, no rash. Neuro:  Strength and sensation are intact. Psych: AAOx3.  Normal affect.  Labs    Chemistry Recent Labs  Lab 08/27/18 2015  08/29/18 0358 08/30/18 0942 08/31/18 0334  NA 132*   < > 136 139 140  K 5.0   < > 5.4* 5.4* 4.5  CL 93*   < > 100 101 101  CO2 31   < > 31 30 32  GLUCOSE 120*   < > 116* 240* 144*  BUN 66*   < > 76* 76* 73*  CREATININE 2.44*   < > 2.46* 2.11* 1.98*  CALCIUM 8.4*   < > 8.8* 8.7* 8.5*  PROT 7.4  --   --   --   --   ALBUMIN 3.2*  --   --   --   --  AST 30  --   --   --   --   ALT 30  --   --   --   --   ALKPHOS 54  --   --   --   --   BILITOT 0.4  --   --   --   --   GFRNONAA 25*   < > 25* 30* 32*  GFRAA 29*   < > 29* 34* 37*  ANIONGAP 8   < > 5 8 7    < > = values in this interval not displayed.     Hematology Recent Labs  Lab 08/30/18 0942 08/31/18 0334 09/01/18 0703  WBC 10.3 11.3* 12.5*  RBC 2.72* 2.63* 3.19*  HGB 8.1* 7.7* 9.6*  HCT 27.1* 25.8* 31.3*  MCV 99.6 98.1 98.1  MCH 29.8 29.3 30.1  MCHC 29.9* 29.8* 30.7  RDW 15.1 15.1 16.3*  PLT 290 306 352    Cardiac Enzymes Recent Labs  Lab 08/27/18 2015 08/28/18 0057 08/28/18 0558  TROPONINI 0.07* 0.06* 0.06*     BNP Recent Labs  Lab 08/27/18 2015  BNP 228.0*      Radiology    Dg Chest 1 View  Result Date: 08/31/2018 CLINICAL DATA:  Shortness of breath. Parkinson's disease. EXAM: CHEST  1 VIEW COMPARISON:  08/29/2018. FINDINGS: Low lung volumes. Cardiomegaly. Moderate vascular congestion, with BILATERAL RIGHT greater than LEFT pleural effusions. Early pulmonary edema not excluded.  IMPRESSION: Cardiomegaly with vascular congestion and BILATERAL effusions. Early pulmonary edema not excluded. For Electronically Signed   By: Staci Righter M.D.   On: 08/31/2018 09:02    Telemetry    Afib, 70's - Personally Reviewed  Cardiac Studies   2D Echocardiogram 5.8.2019 ([place], [place])  EF 50-55%, no rwma, mild to mod MR. Nl RV fxn. PASP 81mmHg. _____________    2D Echocardiogram 2.17.2020  1. The left ventricle has moderately reduced systolic function, with an ejection fraction of 35-40%. The cavity size was normal. Left ventricular diastology could not be evaluated secondary to atrial fibrillation. 2. The right ventricle has normal systolic function. The cavity was mildly enlarged. There is mildly increased right ventricular wall thickness. 3. Left atrial size was mildly dilated. 4. The mitral valve was not well visualized. Mild thickening of the mitral valve leaflet. 5. The tricuspid valve is not well visualized. Tricuspid valve regurgitation is mild-moderate. 6. The aortic valve was not well visualized Moderate thickening of the aortic valve Moderate calcification of the aortic valve. 7. The aortic root is normal in size and structure. 8. The inferior vena cava was dilated in size with <50% respiratory variability. 9. Right atrial pressure is estimated at 15 mmHg. 10. There is akinesis of the mid-apical anteroseptal and apical left ventricular segments. _____________   Patient Profile     78 y.o. male with a history of chronic kidney disease, COPD, obesity, hypertension, atrial fibrillation, Parkinson's disease, and heart failure, who was admitted 2/16 with progressive dyspnea, heart failure, and afib.  Assessment & Plan    1.  Acute systolic CHF:  Records from former cardiologist reviewed.  Echo in 11/2017 showed EF of 50-55%. PASP 52mmHg.  Notes from primary care indicate that rhythm has been regular on examination.  He has been having  issues w/ volume dating back to last April with outpt diuretic adjustments on several occasions.  Minus 1.1L overnight and 2.5 for admission.  Wt recorded as unchanged from yesterday.  Breathing cont to improve, though  he remains markedly volume overloaded on exam.  Of note, wt was 128kg in 10/2017, @ which time he was felt to be volume overloaded by PCP.  Creat pending this AM.  Cont IV diuresis.  Cont  blocker, hydral, nitrate.  No acei/arb/arni/mra 2/2 CKD III.  2.  Persistent atrial fibrillation:  Rate controlled.  Chronicity unclear, though notes from primary care indicate regular rhythm on examination.  Recent PCP note mentions that afib is 'chronic, stable, well rate-controlled,' though exam indicates nl heart sounds. Thus it seems likely that recurrent afib may have contributed to worsening volume status recently w/ new LV dysfxn.  He reports compliance with eliquis as an outpt.  In that setting, we may wish to pursue rhythm control and antiarrhythmic therapy.  He has already indicated that he'd like to avoid DCCV if @ all possible.  I will d/w Dr. Rockey Situ today - ? Add amio.    3.  Elevated troponin: Mild elevation w/ flat trend in setting of volume overload.  Suspect demand ischemia.  As above, prior echo in 11/2017 showed low-normal EF (50-55%) w/ rwma, thus LV dysfxn is new.  Unclear at this point to what extent afib is playing in LV dysfxn.  Following adequate diuresis, he would ideally undergo ischemic eval.  Difficult situation however, as his body habitus makes him a relatively poor candidate for stress testing and CKD prohibits cath.  Cont  blocker.  No ASA in setting of eliquis and anemia.  4.  CKD III:  Creat pending this AM.  5.  Normocytic anemia:  H/H better this AM.  Signed, Murray Hodgkins, NP  09/01/2018, 8:47 AM    For questions or updates, please contact   Please consult [email or web address] for contact info under Cardiology/STEMI.

## 2018-09-01 NOTE — Progress Notes (Signed)
[place] at [place] NAME: Alex Daniel    MR#:  [id number]  DATE OF BIRTH:  05-13-1941  SUBJECTIVE:   States his shortness of breath is much better.  He is able to walk from the chair to the bathroom and back to the chair without becoming short of breath.  He also notes that his lower extremity edema continues to improve.  He denies any chest pain.  REVIEW OF SYSTEMS:  Review of Systems  Constitutional: Negative for chills and fever.  HENT: Negative for congestion and sore throat.   Eyes: Negative for blurred vision and double vision.  Respiratory: Positive for shortness of breath. Negative for cough.   Cardiovascular: Positive for leg swelling. Negative for chest pain.  Gastrointestinal: Negative for nausea and vomiting.  Genitourinary: Negative for dysuria and urgency.  Musculoskeletal: Negative for back pain and neck pain.  Neurological: Negative for dizziness and headaches.  Psychiatric/Behavioral: Negative for depression. The patient is not nervous/anxious.     DRUG ALLERGIES:  No Known Allergies VITALS:  Blood pressure (!) 142/78, pulse 94, temperature 97.7 F (36.5 C), resp. rate 20, height 5\' 11"  (1.803 m), weight 131.5 kg, SpO2 100 %. PHYSICAL EXAMINATION:  Physical Exam  Constitutional: Well-appearing, sitting up on edge of bed in no acute distress HEENT: Normocephalic, atraumatic, EOMI, no scleral icterus, moist mucous membranes  Neck: Supple, normal range of motion Cardiovascular: Irregularly irregular rhythm, regular rate, no murmur, rubs, gallops Respiratory:  Diminished breath sounds in the lung bases bilaterally, no wheezes or crackles, normal work of breathing, nasal cannula in place Gastrointestinal:+BS, soft, nondistended, nontender Musculoskeletal: 2+ edema to the midshin bilaterally improved from previous exam, no cyanosis or clubbing Neurologic:   CN II through XII grossly intact, no focal deficits, sensation  intact throughout Skin:  Skin is warm, dry and intact. No rash noted. Psychiatric: Mood and affect are normal. Speech and behavior are normal. LABORATORY PANEL:  Male CBC Recent Labs  Lab 09/01/18 0703  WBC 12.5*  HGB 9.6*  HCT 31.3*  PLT 352   ------------------------------------------------------------------------------------------------------------------ Chemistries  Recent Labs  Lab 08/27/18 2015  09/01/18 0703  NA 132*   < > 140  K 5.0   < > 5.4*  CL 93*   < > 98  CO2 31   < > 37*  GLUCOSE 120*   < > 119*  BUN 66*   < > 73*  CREATININE 2.44*   < > 2.11*  CALCIUM 8.4*   < > 8.9  MG 2.6*  --   --   AST 30  --   --   ALT 30  --   --   ALKPHOS 54  --   --   BILITOT 0.4  --   --    < > = values in this interval not displayed.   RADIOLOGY:  No results found. ASSESSMENT AND PLAN:   Acute on chronic systolic congestive heart failure- dyspnea on exertion and lower extremity edema continue to improve. -Will decrease Lasix dose from 60 mg twice daily to 40 mg twice daily due to bump in creatinine -Cardiology following-started hydralazine and Imdur -Continue metoprolol -Unable to start ACE/ARB due to renal insufficiency -Echo with EF 35-40% -TED hose ordered -Advised patient to elevate legs as much as possible  AKI in ?CKD 3- creatinine bumped today -Decrease Lasix dose as above -Nephrology following -Renal US with medical renal disease -Avoid nephrotoxic agents -Monitor  Chronic A. fib- rate  controlled -Continue Eliquis and metoprolol -Cards will consider adding amiodarone, patient would like to avoid DCCV if possible  Hyperkalemia-K 5.4.  Due to AKI. -Will give Veltassa x1 today   Elevated troponin- likely secondary to demand ischemia.  Troponins mildly elevated and flat no active chest pain. -Monitor  Chronic hypoxic respiratory failure secondary to COPD-on 2 L O2 at home.  No signs of acute COPD exacerbation. -Continue supplemental oxygen  Normocytic  anemia- hemoglobin improved s/p 1 unit PRBC 2/20 -Transfusion threshold <8 -Anemia panel unremarkable -FOBT pending -Monitor  Depression-stable -Continue vilazodone and Wellbutrin  All the records are reviewed and case discussed with Care Management/Social Worker. Management plans discussed with the patient, family and they are in agreement.  CODE STATUS: Full Code  TOTAL TIME TAKING CARE OF THIS PATIENT: 35 minutes.   More than 50% of the time was spent in counseling/coordination of care: YES  POSSIBLE D/C IN 1-2 DAYS, DEPENDING ON CLINICAL CONDITION.   Berna Spare Jeovany Huitron M.D on 09/01/2018 at 12:54 PM  Between 7am to 6pm - Pager - [phone]  After 6pm go to [email or web address] - Proofreader  [facility]  Office  [phone]  CC: Primary care physician; System, Pcp Not In  Note: This dictation was prepared with Dragon dictation along with smaller phrase technology. Any transcriptional errors that result from this process are unintentional.

## 2018-09-01 NOTE — Plan of Care (Signed)
  Problem: Activity: Goal: Capacity to carry out activities will improve Outcome: Progressing Note:  Up to bathroom with standby assist, tolerating well   Problem: Coping: Goal: Level of anxiety will decrease Outcome: Progressing   Problem: Elimination: Goal: Will not experience complications related to bowel motility Outcome: Progressing Note:  BM this shift, 2/20 Goal: Will not experience complications related to urinary retention Outcome: Progressing   Problem: Pain Managment: Goal: General experience of comfort will improve Outcome: Progressing Note:  No complaints of pain this shift   Problem: Safety: Goal: Ability to remain free from injury will improve Outcome: Progressing   Problem: Skin Integrity: Goal: Risk for impaired skin integrity will decrease Outcome: Progressing   Problem: Education: Goal: Knowledge of General Education information will improve Description Including pain rating scale, medication(s)/side effects and non-pharmacologic comfort measures Outcome: Completed/Met   Problem: Nutrition: Goal: Adequate nutrition will be maintained Outcome: Completed/Met

## 2018-09-02 DIAGNOSIS — I5022 Chronic systolic (congestive) heart failure: Secondary | ICD-10-CM

## 2018-09-02 LAB — BASIC METABOLIC PANEL
Anion gap: 6 (ref 5–15)
BUN: 76 mg/dL — ABNORMAL HIGH (ref 8–23)
CO2: 37 mmol/L — ABNORMAL HIGH (ref 22–32)
CREATININE: 2.01 mg/dL — AB (ref 0.61–1.24)
Calcium: 9 mg/dL (ref 8.9–10.3)
Chloride: 98 mmol/L (ref 98–111)
GFR calc non Af Amer: 32 mL/min — ABNORMAL LOW (ref >60)
GFR, EST AFRICAN AMERICAN: 37 mL/min — AB (ref >60)
Glucose, Bld: 141 mg/dL — ABNORMAL HIGH (ref 70–99)
Potassium: 5.5 mmol/L — ABNORMAL HIGH (ref 3.5–5.1)
Sodium: 141 mmol/L (ref 135–145)

## 2018-09-02 LAB — CBC
HCT: 30.6 % — ABNORMAL LOW (ref 39.0–52.0)
Hemoglobin: 9.2 g/dL — ABNORMAL LOW (ref 13.0–17.0)
MCH: 30 pg (ref 26.0–34.0)
MCHC: 30.1 g/dL (ref 30.0–36.0)
MCV: 99.7 fL (ref 80.0–100.0)
NRBC: 0 % (ref 0.0–0.2)
Platelets: 304 10*3/uL (ref 150–400)
RBC: 3.07 MIL/uL — ABNORMAL LOW (ref 4.22–5.81)
RDW: 15.7 % — ABNORMAL HIGH (ref 11.5–15.5)
WBC: 13.6 10*3/uL — ABNORMAL HIGH (ref 4.0–10.5)

## 2018-09-02 MED ORDER — POLYETHYLENE GLYCOL 3350 17 G PO PACK
17.0000 g | PACK | Freq: Every day | ORAL | Status: DC
  Administered 2018-09-02: 17 g via ORAL
  Filled 2018-09-02 (×2): qty 1

## 2018-09-02 MED ORDER — PATIROMER SORBITEX CALCIUM 8.4 G PO PACK
16.8000 g | PACK | Freq: Every day | ORAL | Status: DC
  Administered 2018-09-03: 16.8 g via ORAL
  Filled 2018-09-02: qty 2

## 2018-09-02 MED ORDER — TORSEMIDE 20 MG PO TABS
40.0000 mg | ORAL_TABLET | Freq: Two times a day (BID) | ORAL | Status: DC
  Administered 2018-09-02 – 2018-09-03 (×2): 40 mg via ORAL
  Filled 2018-09-02 (×2): qty 2

## 2018-09-02 NOTE — Progress Notes (Signed)
[place] at [place] NAME: Taha Dimond    MR#:  [id number]  DATE OF BIRTH:  03-02-1941  SUBJECTIVE:   Patient endorses lower extremity edema and shortness of breath getting better.  Family members at bedside  REVIEW OF SYSTEMS:  Review of Systems  Constitutional: Negative for chills and fever.  HENT: Negative for congestion and sore throat.   Eyes: Negative for blurred vision and double vision.  Respiratory: Positive for shortness of breath. Negative for cough.   Cardiovascular: Positive for leg swelling. Negative for chest pain.  Gastrointestinal: Negative for nausea and vomiting.  Genitourinary: Negative for dysuria and urgency.  Musculoskeletal: Negative for back pain and neck pain.  Neurological: Negative for dizziness and headaches.  Psychiatric/Behavioral: Negative for depression. The patient is not nervous/anxious.     DRUG ALLERGIES:  No Known Allergies VITALS:  Blood pressure 129/63, pulse 90, temperature 97.7 F (36.5 C), temperature source Oral, resp. rate 18, height 5\' 11"  (1.803 m), weight 131.4 kg, SpO2 97 %. PHYSICAL EXAMINATION:  Physical Exam  Constitutional: Well-appearing, sitting up on edge of bed in no acute distress HEENT: Normocephalic, atraumatic, EOMI, no scleral icterus, moist mucous membranes  Neck: Supple, normal range of motion Cardiovascular: Irregularly irregular rhythm, regular rate, no murmur, rubs, gallops Respiratory:  Diminished breath sounds in the lung bases bilaterally, no wheezes or crackles, normal work of breathing, nasal cannula in place Gastrointestinal:+BS, soft, nondistended, nontender Musculoskeletal: 2+ edema to the midshin bilaterally improved from previous exam, no cyanosis or clubbing Neurologic:   CN II through XII grossly intact, no focal deficits, sensation intact throughout Skin:  Skin is warm, dry and intact. No rash noted. Psychiatric: Mood and affect are normal. Speech and  behavior are normal. LABORATORY PANEL:  Male CBC Recent Labs  Lab 09/02/18 0444  WBC 13.6*  HGB 9.2*  HCT 30.6*  PLT 304   ------------------------------------------------------------------------------------------------------------------ Chemistries  Recent Labs  Lab 08/27/18 2015  09/02/18 0444  NA 132*   < > 141  K 5.0   < > 5.5*  CL 93*   < > 98  CO2 31   < > 37*  GLUCOSE 120*   < > 141*  BUN 66*   < > 76*  CREATININE 2.44*   < > 2.01*  CALCIUM 8.4*   < > 9.0  MG 2.6*  --   --   AST 30  --   --   ALT 30  --   --   ALKPHOS 54  --   --   BILITOT 0.4  --   --    < > = values in this interval not displayed.   RADIOLOGY:  No results found. ASSESSMENT AND PLAN:   Acute on chronic systolic congestive heart failure- dyspnea on exertion and lower extremity edema continue to improve. -Will decrease Lasix dose from 60 mg twice daily to 40 mg twice daily due to elevated creatinine currently creatinine is trending down we will continue the same 40 mg of Lasix -Cardiology following-started hydralazine and Imdur -Continue metoprolol -Unable to start ACE/ARB due to renal insufficiency -Echo with EF 35-40% -TED hose ordered -Advised patient to elevate legs as much as possible  AKI in ?CKD 3- creatinine is getting better -Decrease Lasix dose as above -Nephrology following -Renal US with medical renal disease -Avoid nephrotoxic agents -Monitor.  Chronic A. fib- rate controlled -Continue Eliquis and metoprolol -Cards will consider adding amiodarone, patient would like to avoid DCCV  if possible  Hyperkalemia-K 5.5.  Due to AKI. - Veltassa x1 also patient is on  Lasix   Elevated troponin- likely secondary to demand ischemia.  Troponins mildly elevated and flat no active chest pain. -Monitor  Chronic hypoxic respiratory failure secondary to COPD-on 2 L O2 at home.  No signs of acute COPD exacerbation. -Continue supplemental oxygen  Normocytic anemia- hemoglobin improved  s/p 1 unit PRBC 2/20 -Transfusion threshold <8 -Anemia panel unremarkable -FOBT pending -Monitor  Depression-stable -Continue vilazodone and Wellbutrin  Generalized weakness PT consult  All the records are reviewed and case discussed with Care Management/Social Worker. Management plans discussed with the patient, family and they are in agreement.  CODE STATUS: Full Code  TOTAL TIME TAKING CARE OF THIS PATIENT: 35 minutes.   More than 50% of the time was spent in counseling/coordination of care: YES  POSSIBLE D/C IN 1-2 DAYS, DEPENDING ON CLINICAL CONDITION.   Nicholes Mango M.D on 09/02/2018 at 3:09 PM  Between 7am to 6pm - Pager - [phone]  After 6pm go to [email or web address] - Proofreader  [facility]  Office  [phone]  CC: Primary care physician; System, Pcp Not In  Note: This dictation was prepared with Dragon dictation along with smaller phrase technology. Any transcriptional errors that result from this process are unintentional.

## 2018-09-02 NOTE — Progress Notes (Signed)
[place], [place] 09/02/18  Subjective:   Patient feels well today.  No acute complaints.  Continues to have large amount of lower extremity edema.  Getting IV furosemide with good response.  Urine output recorded at >1000 cc.  S Creatinine continues to fluctuate slightly.  Today's level is 2.01 Patient's daughter is in the room with him States that he is able to walk in the room to the bathroom  Objective:  Vital signs in last 24 hours:  Temp:  [97.7 F (36.5 C)-99.1 F (37.3 C)] 97.7 F (36.5 C) (02/22 0810) Pulse Rate:  [90-99] 90 (02/22 0810) Resp:  [16-20] 18 (02/22 0810) BP: (124-147)/(52-75) 129/63 (02/22 0810) SpO2:  [97 %-98 %] 97 % (02/22 0810) Weight:  [131.4 kg] 131.4 kg (02/22 0434)  Weight change: -0.091 kg Filed Weights   08/31/18 0242 09/01/18 0651 09/02/18 0434  Weight: 131.5 kg 131.5 kg 131.4 kg    Intake/Output:    Intake/Output Summary (Last 24 hours) at 09/02/2018 1003 Last data filed at 09/01/2018 1900 Gross per 24 hour  Intake -  Output 800 ml  Net -800 ml     Physical Exam: General:  Sitting in the chair, no acute distress  HEENT  anicteric, moist oral mucous membranes  Neck:  Supple  Lungs:  Mild scattered rhonchi, no crackles  Heart::  No rub  Abdomen:  Soft, obese, nontender  Extremities:  2+ pitting edema on left, 1+ on right, bilateral thigh edema  Neurologic:  Alert, oriented  Skin:  No rashes     Basic Metabolic Panel:  Recent Labs  Lab 08/27/18 2015  08/29/18 0358 08/30/18 0942 08/31/18 0334 09/01/18 0703 09/02/18 0444  NA 132*   < > 136 139 140 140 141  K 5.0   < > 5.4* 5.4* 4.5 5.4* 5.5*  CL 93*   < > 100 101 101 98 98  CO2 31   < > 31 30 32 37* 37*  GLUCOSE 120*   < > 116* 240* 144* 119* 141*  BUN 66*   < > 76* 76* 73* 73* 76*  CREATININE 2.44*   < > 2.46* 2.11* 1.98* 2.11* 2.01*  CALCIUM 8.4*   < > 8.8* 8.7* 8.5* 8.9 9.0  MG 2.6*  --   --   --   --   --   --   PHOS 3.9  --   --    --   --   --   --    < > = values in this interval not displayed.     CBC: Recent Labs  Lab 08/29/18 0358 08/30/18 0942 08/31/18 0334 09/01/18 0703 09/02/18 0444  WBC 12.4* 10.3 11.3* 12.5* 13.6*  HGB 9.0* 8.1* 7.7* 9.6* 9.2*  HCT 30.2* 27.1* 25.8* 31.3* 30.6*  MCV 99.0 99.6 98.1 98.1 99.7  PLT 326 290 306 352 304     No results found for: HEPBSAG, HEPBSAB, HEPBIGM    Microbiology:  No results found for this or any previous visit (from the past 240 hour(s)).  Coagulation Studies: No results for input(s): LABPROT, INR in the last 72 hours.  Urinalysis: No results for input(s): COLORURINE, LABSPEC, PHURINE, GLUCOSEU, HGBUR, BILIRUBINUR, KETONESUR, PROTEINUR, UROBILINOGEN, NITRITE, LEUKOCYTESUR in the last 72 hours.  Invalid input(s): APPERANCEUR    Imaging: No results found.   Medications:    . sodium chloride   Intravenous Once  . apixaban  5 mg Oral BID  . atorvastatin  20 mg Oral q1800  .  buPROPion  300 mg Oral Daily  . carbamazepine  100 mg Oral BID  . clonazePAM  0.5 mg Oral QHS  . feeding supplement (ENSURE ENLIVE)  237 mL Oral BID BM  . furosemide  40 mg Intravenous BID  . hydrALAZINE  10 mg Oral Q8H  . isosorbide mononitrate  15 mg Oral Daily  . mouth rinse  15 mL Mouth Rinse BID  . metoprolol succinate  50 mg Oral Daily  . montelukast  10 mg Oral QPM  . multivitamin with minerals  1 tablet Oral Daily  . ondansetron (ZOFRAN) IV  4 mg Intravenous Once  . patiromer  8.4 g Oral Daily  . sodium chloride flush  3 mL Intravenous Once  . tamsulosin  0.4 mg Oral Daily  . Vilazodone HCl  40 mg Oral Daily   acetaminophen **OR** acetaminophen, albuterol, benzonatate, bisacodyl, guaiFENesin, ondansetron **OR** ondansetron (ZOFRAN) IV, senna-docusate  Assessment/ Plan:  78 y.o. male with hypertension, congestive heart failure, chronic kidney disease, was admitted on 08/27/2018 with shortness of breath  Chronic kidney disease stage 3 Outpatient records  from PMD office were reviewed.  Patient's GFR has declined over the last year from 76 back in April 2019 to 31/32 more recently in February 2020 Chronic kidney disease is likely secondary to likely atherosclerosis and hypertension Renal ultrasound negative for hydronephrosis but does show medical renal disease Urinalysis shows small hemoglobin, 0-5 RBCs, WBC 21-50 Fluctuating renal function due to volume shifts with diuresis Avoiding ACE inhibitor or ARB due to fluctuating renal function and patient undergoing active diuresis We will consider low-dose ARB as outpatient  Lower extremity edema Follow salt restricted diet Asymmetric edema- lower extremity Doppler negative for DVT 2D echo (08/28/18)  shows moderately reduced LV systolic function with EF 35 to 40% Agree with trial of IV furosemide.  May try to switch to oral torsemide 40 mg daily tomorrow Continue to monitor weight daily  Mild hyperkalemia Agree with maintenance of Veltassa.  Increase dose to 16.8 g  Cardiac- acute on chronic systolic CHF, EF 35 to 24%, atrial fibrillation.  Echo 08/28/2018 Followed by [facility] cardiology   LOS: [place] 2/22/202010:03 [place], [place]  Note: This note was prepared with Dragon dictation. Any transcription errors are unintentional

## 2018-09-02 NOTE — Progress Notes (Addendum)
Progress Note  Patient Name: Alex Daniel Date of Encounter: 09/02/2018  Primary Cardiologist: Nelva Bush, MD  Subjective   Breathing sl better.  Able to ambulate to the bathroom w/o difficulty. Very eager to go home.  Inpatient Medications    Scheduled Meds: . sodium chloride   Intravenous Once  . apixaban  5 mg Oral BID  . atorvastatin  20 mg Oral q1800  . buPROPion  300 mg Oral Daily  . carbamazepine  100 mg Oral BID  . clonazePAM  0.5 mg Oral QHS  . feeding supplement (ENSURE ENLIVE)  237 mL Oral BID BM  . furosemide  40 mg Intravenous BID  . hydrALAZINE  10 mg Oral Q8H  . isosorbide mononitrate  15 mg Oral Daily  . mouth rinse  15 mL Mouth Rinse BID  . metoprolol succinate  50 mg Oral Daily  . montelukast  10 mg Oral QPM  . multivitamin with minerals  1 tablet Oral Daily  . ondansetron (ZOFRAN) IV  4 mg Intravenous Once  . patiromer  8.4 g Oral Daily  . sodium chloride flush  3 mL Intravenous Once  . tamsulosin  0.4 mg Oral Daily  . Vilazodone HCl  40 mg Oral Daily   Continuous Infusions:  PRN Meds: acetaminophen **OR** acetaminophen, albuterol, benzonatate, bisacodyl, guaiFENesin, ondansetron **OR** ondansetron (ZOFRAN) IV, senna-docusate   Vital Signs    Vitals:   09/01/18 1732 09/01/18 1932 09/02/18 0434 09/02/18 0810  BP: (!) 147/52 (!) 143/75 124/72 129/63  Pulse: 96 99 97 90  Resp:  16 20 18   Temp: 98.8 F (37.1 C) 99.1 F (37.3 C) 97.7 F (36.5 C) 97.7 F (36.5 C)  TempSrc: Oral Oral Oral Oral  SpO2: 98% 97% 97% 97%  Weight:   131.4 kg   Height:        Intake/Output Summary (Last 24 hours) at 09/02/2018 0839 Last data filed at 09/01/2018 1900 Gross per 24 hour  Intake -  Output 800 ml  Net -800 ml   Filed Weights   08/31/18 0242 09/01/18 0651 09/02/18 0434  Weight: 131.5 kg 131.5 kg 131.4 kg    Physical Exam   GEN:  Morbidly obese, in no acute distress.  HEENT: Grossly normal.  Neck: Supple, JVD to jaw, no carotid bruits, or  masses. Cardiac: IR,IR, no murmurs, rubs, or gallops. No clubbing, cyanosis, 1+ RLE/2+ LLE  edema.  Radials/DP/PT 2+ and equal bilaterally.  Respiratory:  Respirations regular and unlabored, clear to auscultation bilaterally. GI: Softer than prior days, nontender, nondistended, BS + x 4. MS: no deformity or atrophy. Skin: warm and dry, no rash. Neuro:  Strength and sensation are intact. Psych: AAOx3.  Normal affect.  Labs    Chemistry Recent Labs  Lab 08/27/18 2015  08/31/18 0334 09/01/18 0703 09/02/18 0444  NA 132*   < > 140 140 141  K 5.0   < > 4.5 5.4* 5.5*  CL 93*   < > 101 98 98  CO2 31   < > 32 37* 37*  GLUCOSE 120*   < > 144* 119* 141*  BUN 66*   < > 73* 73* 76*  CREATININE 2.44*   < > 1.98* 2.11* 2.01*  CALCIUM 8.4*   < > 8.5* 8.9 9.0  PROT 7.4  --   --   --   --   ALBUMIN 3.2*  --   --   --   --   AST 30  --   --   --   --  ALT 30  --   --   --   --   ALKPHOS 54  --   --   --   --   BILITOT 0.4  --   --   --   --   GFRNONAA 25*   < > 32* 30* 32*  GFRAA 29*   < > 37* 34* 37*  ANIONGAP 8   < > 7 5 6    < > = values in this interval not displayed.     Hematology Recent Labs  Lab 08/31/18 0334 09/01/18 0703 09/02/18 0444  WBC 11.3* 12.5* 13.6*  RBC 2.63* 3.19* 3.07*  HGB 7.7* 9.6* 9.2*  HCT 25.8* 31.3* 30.6*  MCV 98.1 98.1 99.7  MCH 29.3 30.1 30.0  MCHC 29.8* 30.7 30.1  RDW 15.1 16.3* 15.7*  PLT 306 352 304    Cardiac Enzymes Recent Labs  Lab 08/27/18 2015 08/28/18 0057 08/28/18 0558  TROPONINI 0.07* 0.06* 0.06*     BNP Recent Labs  Lab 08/27/18 2015  BNP 228.0*     Radiology    No results found.  Telemetry    Afib, 80's - Personally Reviewed  Cardiac Studies   2D Echocardiogram 5.8.2019 ([place], [place])  EF 50-55%, no rwma, mild to mod MR. Nl RV fxn. PASP 59mmHg. _____________    2D Echocardiogram2.17.2020  1. The left ventricle has moderately reduced systolic function, with an ejection fraction  of 35-40%. The cavity size was normal. Left ventricular diastology could not be evaluated secondary to atrial fibrillation. 2. The right ventricle has normal systolic function. The cavity was mildly enlarged. There is mildly increased right ventricular wall thickness. 3. Left atrial size was mildly dilated. 4. The mitral valve was not well visualized. Mild thickening of the mitral valve leaflet. 5. The tricuspid valve is not well visualized. Tricuspid valve regurgitation is mild-moderate. 6. The aortic valve was not well visualized Moderate thickening of the aortic valve Moderate calcification of the aortic valve. 7. The aortic root is normal in size and structure. 8. The inferior vena cava was dilated in size with <50% respiratory variability. 9. Right atrial pressure is estimated at 15 mmHg. 10. There is akinesis of the mid-apical anteroseptal and apical left ventricular segments. _____________   Patient Profile     78 y.o.malewith a history of chronic kidney disease, COPD, obesity, hypertension, atrial fibrillation, Parkinson's disease, and heart failure,who was admitted 2/16 with progressive dyspnea, heart failure, and afib.  Assessment & Plan    1.  Acute systolic CHF:   Echo in 07/6107 showed EF of 50-55%. PASP 67mmHg.  Notes from primary care indicate that rhythm has been regular on examination.  He has been having issues w/ volume dating back to last April with outpt diuretic adjustments on several occasions.  Minus 1L overnight w/o significant change in wt. Minus 3.5L since admission. Renal fxn stable.   Still markedly volume overloaded. Cont IV diuresis,  blocker, hydral, nitrate.  No acei/arb/arni/mra 2/2 CKD III. He is very eager to go home and says he's not sure that he can bear to stay much longer.  Discussed that he is still likely markedly volume overloaded.  We may have to convert to oral torsemide tomorrow b/c I suspect he will be asking to leave AMA.  2.  Persistent  atrial fibrillation:  Rate controlled. Dtr says that he was cardioverted, perhaps in 2017 and that it held for awhile but at some point later, they were told that  he was back in afib.   As prev noted, was reported to have regular rate and rhythm on multiple outpatient clinic visits (records on chart hard copy), thus recurrent afib may be playing a significant role in #1.  He is not interested in considering repeat dccv.  We could consider addition of amio for potential medical cardioversion (reports compliance w/ eliquis @ home), though likelihood of maintaining sinus in the long term is likely low.  Cont  blocker and eliquis.  3.  Elevated trop:  Mild elevation w/ flat trend in setting of volume overload.  Suspect demand ischemia.  As above, prior in echo in 11/2017 showed low-nl EF (50-55%) w/o rwma, thus LV dysfxn new.  Remains unclear to what extent afib is playing a role in LV dysfxn.  Poor candidate for nuclear testing 2/2 body habitus but could consider 2 day study.  Not ideal cath candidate 2/2 CKD.  4.  CKD III:  Creat stable this AM.  5.  Normocytic anemia:  Stable.  6.  Hyperkalemia:  K 5.5 this AM. On valtessa.  Mgmt per IM/nephrology.  Signed, Murray Hodgkins, NP  09/02/2018, 8:39 AM    For questions or updates, please contact   Please consult [email or web address] for contact info under Cardiology/STEMI.   Attending Note:   The patient was seen and examined.  Agree with assessment and plan as noted above.  Changes made to the above note as needed.  Patient seen and independently examined with Ignacia Bayley, NP .   We discussed all aspects of the encounter. I agree with the assessment and plan as stated above.  Acute combined systolic and diastolic congestive heart failure: Patient had an echocardiogram on August 28, 2018 which revealed mildly depressed left ventricular systolic function with an EF of 35 to 40%.  He has presumed diastolic dysfunction as well but this could not be  formally diagnosed because of the atrial fibrillation. Ventricle is mildly enlarged.  He is still volume overloaded - has persistent leg edema . Continue IV lasix. With his CKD ( Creatinine ~ 2.01) , I agree that he would likely  do better on torsemide than the Furosemide that he was on at home - would start torsemide 40 mg PO BID tomorrow    I have spent a total of 40 minutes with patient reviewing hospital  notes , telemetry, EKGs, labs and examining patient as well as establishing an assessment and plan that was discussed with the patient. > 50% of time was spent in direct patient care.    Thayer Headings, Brooke Bonito., MD, [facility] 09/02/2018, 11:30 AM [address],  [place] Pager [phone]

## 2018-09-03 DIAGNOSIS — I5023 Acute on chronic systolic (congestive) heart failure: Secondary | ICD-10-CM

## 2018-09-03 LAB — HEMOGLOBIN AND HEMATOCRIT, BLOOD
HCT: 29.1 % — ABNORMAL LOW (ref 39.0–52.0)
Hemoglobin: 8.9 g/dL — ABNORMAL LOW (ref 13.0–17.0)

## 2018-09-03 LAB — BASIC METABOLIC PANEL
Anion gap: 7 (ref 5–15)
BUN: 71 mg/dL — AB (ref 8–23)
CO2: 37 mmol/L — ABNORMAL HIGH (ref 22–32)
CREATININE: 1.88 mg/dL — AB (ref 0.61–1.24)
Calcium: 9 mg/dL (ref 8.9–10.3)
Chloride: 96 mmol/L — ABNORMAL LOW (ref 98–111)
GFR calc Af Amer: 40 mL/min — ABNORMAL LOW (ref >60)
GFR calc non Af Amer: 34 mL/min — ABNORMAL LOW (ref >60)
Glucose, Bld: 115 mg/dL — ABNORMAL HIGH (ref 70–99)
Potassium: 5 mmol/L (ref 3.5–5.1)
Sodium: 140 mmol/L (ref 135–145)

## 2018-09-03 MED ORDER — GUAIFENESIN ER 600 MG PO TB12: 600.0000 mg | ORAL_TABLET | Freq: Two times a day (BID) | ORAL | Status: AC | PRN

## 2018-09-03 MED ORDER — ADULT MULTIVITAMIN W/MINERALS CH: 1.0000 | ORAL_TABLET | Freq: Every day | ORAL | Status: AC

## 2018-09-03 MED ORDER — ENSURE ENLIVE PO LIQD: 237.0000 mL | Freq: Two times a day (BID) | ORAL | 0 refills | Status: AC

## 2018-09-03 MED ORDER — TORSEMIDE 20 MG PO TABS: 40.0000 mg | ORAL_TABLET | Freq: Two times a day (BID) | ORAL | 0 refills | Status: AC

## 2018-09-03 MED ORDER — HYDRALAZINE HCL 10 MG PO TABS: 10.0000 mg | ORAL_TABLET | Freq: Three times a day (TID) | ORAL | 0 refills | Status: AC

## 2018-09-03 MED ORDER — POLYETHYLENE GLYCOL 3350 17 G PO PACK: 17.0000 g | PACK | Freq: Every day | ORAL | 0 refills | Status: AC

## 2018-09-03 MED ORDER — ISOSORBIDE MONONITRATE ER 30 MG PO TB24: 15.0000 mg | ORAL_TABLET | Freq: Every day | ORAL | 0 refills | Status: AC

## 2018-09-03 MED ORDER — ATORVASTATIN CALCIUM 20 MG PO TABS: 20.0000 mg | ORAL_TABLET | Freq: Every day | ORAL | 0 refills | Status: AC

## 2018-09-03 MED ORDER — SENNOSIDES-DOCUSATE SODIUM 8.6-50 MG PO TABS: 1.0000 | ORAL_TABLET | Freq: Every evening | ORAL | Status: AC | PRN

## 2018-09-03 NOTE — Progress Notes (Signed)
[place], [place] 09/03/18  Subjective:   Patient feels well today.  No acute complaints.  Ready for discharge today.  Still has some lower extremity edema but feels that overall he is improved.  Lab results from today show slight improvement in serum creatinine to 1.88/GFR 34.  Objective:  Vital signs in last 24 hours:  Temp:  [97.7 F (36.5 C)-97.9 F (36.6 C)] 97.9 F (36.6 C) (02/23 0839) Pulse Rate:  [88-97] 88 (02/23 0839) Resp:  [22-24] 22 (02/23 0858) BP: (104-151)/(49-93) 151/93 (02/23 0839) SpO2:  [99 %-100 %] 99 % (02/23 0839) Weight:  [129.4 kg-130.7 kg] 129.4 kg (02/23 0918)  Weight change: -0.712 kg Filed Weights   09/02/18 0434 09/03/18 0400 09/03/18 0918  Weight: 131.4 kg 130.7 kg 129.4 kg    Intake/Output:    Intake/Output Summary (Last 24 hours) at 09/03/2018 1704 Last data filed at 09/02/2018 2051 Gross per 24 hour  Intake -  Output 900 ml  Net -900 ml     Physical Exam: General:  Sitting in the chair, no acute distress  HEENT  anicteric, moist oral mucous membranes  Neck:  Supple  Lungs:  Mild scattered rhonchi, no crackles  Heart::  No rub  Abdomen:  Soft, obese, nontender  Extremities: + pitting edema bilaterally  Neurologic:  Alert, oriented  Skin:  No rashes     Basic Metabolic Panel:  Recent Labs  Lab 08/27/18 2015  08/30/18 0942 08/31/18 0334 09/01/18 0703 09/02/18 0444 09/03/18 0513  NA 132*   < > 139 140 140 141 140  K 5.0   < > 5.4* 4.5 5.4* 5.5* 5.0  CL 93*   < > 101 101 98 98 96*  CO2 31   < > 30 32 37* 37* 37*  GLUCOSE 120*   < > 240* 144* 119* 141* 115*  BUN 66*   < > 76* 73* 73* 76* 71*  CREATININE 2.44*   < > 2.11* 1.98* 2.11* 2.01* 1.88*  CALCIUM 8.4*   < > 8.7* 8.5* 8.9 9.0 9.0  MG 2.6*  --   --   --   --   --   --   PHOS 3.9  --   --   --   --   --   --    < > = values in this interval not displayed.     CBC: Recent Labs  Lab 08/29/18 0358 08/30/18 0942 08/31/18 0334  09/01/18 0703 09/02/18 0444 09/03/18 0514  WBC 12.4* 10.3 11.3* 12.5* 13.6*  --   HGB 9.0* 8.1* 7.7* 9.6* 9.2* 8.9*  HCT 30.2* 27.1* 25.8* 31.3* 30.6* 29.1*  MCV 99.0 99.6 98.1 98.1 99.7  --   PLT 326 290 306 352 304  --      No results found for: HEPBSAG, HEPBSAB, HEPBIGM    Microbiology:  No results found for this or any previous visit (from the past 240 hour(s)).  Coagulation Studies: No results for input(s): LABPROT, INR in the last 72 hours.  Urinalysis: No results for input(s): COLORURINE, LABSPEC, PHURINE, GLUCOSEU, HGBUR, BILIRUBINUR, KETONESUR, PROTEINUR, UROBILINOGEN, NITRITE, LEUKOCYTESUR in the last 72 hours.  Invalid input(s): APPERANCEUR    Imaging: No results found.   Medications:    . sodium chloride   Intravenous Once  . apixaban  5 mg Oral BID  . atorvastatin  20 mg Oral q1800  . buPROPion  300 mg Oral Daily  . carbamazepine  100 mg Oral BID  .  clonazePAM  0.5 mg Oral QHS  . feeding supplement (ENSURE ENLIVE)  237 mL Oral BID BM  . hydrALAZINE  10 mg Oral Q8H  . isosorbide mononitrate  15 mg Oral Daily  . mouth rinse  15 mL Mouth Rinse BID  . metoprolol succinate  50 mg Oral Daily  . montelukast  10 mg Oral QPM  . multivitamin with minerals  1 tablet Oral Daily  . ondansetron (ZOFRAN) IV  4 mg Intravenous Once  . patiromer  16.8 g Oral Daily  . polyethylene glycol  17 g Oral Daily  . sodium chloride flush  3 mL Intravenous Once  . tamsulosin  0.4 mg Oral Daily  . torsemide  40 mg Oral BID  . Vilazodone HCl  40 mg Oral Daily   acetaminophen **OR** acetaminophen, albuterol, benzonatate, bisacodyl, guaiFENesin, ondansetron **OR** ondansetron (ZOFRAN) IV, senna-docusate  Assessment/ Plan:  78 y.o. male with hypertension, congestive heart failure, chronic kidney disease, was admitted on 08/27/2018 with shortness of breath  Chronic kidney disease stage 3 Outpatient records from PMD office were reviewed.  Patient's GFR has declined over the  last year from 43 back in April 2019 to 31/32 more recently in February 2020 Chronic kidney disease is likely secondary to likely atherosclerosis and hypertension Renal ultrasound negative for hydronephrosis but does show medical renal disease Urinalysis shows small hemoglobin, 0-5 RBCs, WBC 21-50 Fluctuating renal function due to volume shifts with diuresis Avoiding ACE inhibitor or ARB due to fluctuating renal function and patient undergoing active diuresis We will consider low-dose ARB as outpatient  Lower extremity edema Follow salt restricted diet Asymmetric edema- lower extremity Doppler negative for DVT 2D echo (08/28/18)  shows moderately reduced LV systolic function with EF 35 to 40% Agree with switching to oral torsemide.  Currently be discharged on 40 mg twice a day Continue to monitor weight daily Follow-up as outpatient  Mild hyperkalemia Agree with maintenance of Veltassa.  Increase dose to 16.8 g  Cardiac- acute on chronic systolic CHF, EF 35 to 61%, atrial fibrillation.  Echo 08/28/2018 Followed by [facility] cardiology   LOS: [place] 2/23/20205:04 PM  [place], [place]  Note: This note was prepared with Dragon dictation. Any transcription errors are unintentional

## 2018-09-03 NOTE — Discharge Instructions (Signed)
Follow-up with primary care physician in 3 days Follow-up with CHF clinic in 2 days Daily weight monitoring, intake and output Follow-up with nephrology Dr. Candiss Norse in 1 to 2 weeks

## 2018-09-03 NOTE — Progress Notes (Signed)
Patient wants to be discharged this morning.  He says "he can pee at home".  I contacted Dr. Margaretmary Eddy.  Telemetry removed, he doesn't want it anymore.

## 2018-09-03 NOTE — Progress Notes (Addendum)
Progress Note  Patient Name: Alex Daniel Date of Encounter: 09/03/2018  Primary Cardiologist: Nelva Bush, MD  Subjective   Breathing ok with ambulation to bathroom.  Very eager to leave.  Inpatient Medications    Scheduled Meds: . sodium chloride   Intravenous Once  . apixaban  5 mg Oral BID  . atorvastatin  20 mg Oral q1800  . buPROPion  300 mg Oral Daily  . carbamazepine  100 mg Oral BID  . clonazePAM  0.5 mg Oral QHS  . feeding supplement (ENSURE ENLIVE)  237 mL Oral BID BM  . hydrALAZINE  10 mg Oral Q8H  . isosorbide mononitrate  15 mg Oral Daily  . mouth rinse  15 mL Mouth Rinse BID  . metoprolol succinate  50 mg Oral Daily  . montelukast  10 mg Oral QPM  . multivitamin with minerals  1 tablet Oral Daily  . ondansetron (ZOFRAN) IV  4 mg Intravenous Once  . patiromer  16.8 g Oral Daily  . polyethylene glycol  17 g Oral Daily  . sodium chloride flush  3 mL Intravenous Once  . tamsulosin  0.4 mg Oral Daily  . torsemide  40 mg Oral BID  . Vilazodone HCl  40 mg Oral Daily   Continuous Infusions:  PRN Meds: acetaminophen **OR** acetaminophen, albuterol, benzonatate, bisacodyl, guaiFENesin, ondansetron **OR** ondansetron (ZOFRAN) IV, senna-docusate   Vital Signs    Vitals:   09/02/18 0810 09/02/18 1616 09/02/18 2022 09/03/18 0400  BP: 129/63 125/63 115/79 (!) 104/49  Pulse: 90 93 97 90  Resp: 18 18 (!) 24   Temp: 97.7 F (36.5 C) 97.7 F (36.5 C) 97.7 F (36.5 C) 97.8 F (36.6 C)  TempSrc: Oral  Oral Oral  SpO2: 97% 98% 99% 100%  Weight:    130.7 kg  Height:        Intake/Output Summary (Last 24 hours) at 09/03/2018 0737 Last data filed at 09/02/2018 2051 Gross per 24 hour  Intake -  Output 1800 ml  Net -1800 ml   Filed Weights   09/01/18 0651 09/02/18 0434 09/03/18 0400  Weight: 131.5 kg 131.4 kg 130.7 kg    Physical Exam   GEN: Well nourished, well developed, in no acute distress.  HEENT: Grossly normal.  Neck: Supple, mod elev JVD, no  carotid bruits, or masses. Cardiac: IR, IR, no murmurs, rubs, or gallops. No clubbing, cyanosis, 2+ bilat LE edema.  Radials/DP/PT 2+ and equal bilaterally.  Respiratory:  Respirations regular and unlabored, bibasilar crackles. GI: Obese, protuberant, semi-firm this AM, nontender, BS + x 4. MS: no deformity or atrophy. Skin: warm and dry, no rash. Neuro:  Strength and sensation are intact. Psych: AAOx3.  Normal affect.  Labs    Chemistry Recent Labs  Lab 08/27/18 2015  09/01/18 0703 09/02/18 0444 09/03/18 0513  NA 132*   < > 140 141 140  K 5.0   < > 5.4* 5.5* 5.0  CL 93*   < > 98 98 96*  CO2 31   < > 37* 37* 37*  GLUCOSE 120*   < > 119* 141* 115*  BUN 66*   < > 73* 76* 71*  CREATININE 2.44*   < > 2.11* 2.01* 1.88*  CALCIUM 8.4*   < > 8.9 9.0 9.0  PROT 7.4  --   --   --   --   ALBUMIN 3.2*  --   --   --   --   AST 30  --   --   --   --  ALT 30  --   --   --   --   ALKPHOS 54  --   --   --   --   BILITOT 0.4  --   --   --   --   GFRNONAA 25*   < > 30* 32* 34*  GFRAA 29*   < > 34* 37* 40*  ANIONGAP 8   < > 5 6 7    < > = values in this interval not displayed.     Hematology Recent Labs  Lab 08/31/18 0334 09/01/18 0703 09/02/18 0444 09/03/18 0514  WBC 11.3* 12.5* 13.6*  --   RBC 2.63* 3.19* 3.07*  --   HGB 7.7* 9.6* 9.2* 8.9*  HCT 25.8* 31.3* 30.6* 29.1*  MCV 98.1 98.1 99.7  --   MCH 29.3 30.1 30.0  --   MCHC 29.8* 30.7 30.1  --   RDW 15.1 16.3* 15.7*  --   PLT 306 352 304  --     Cardiac Enzymes Recent Labs  Lab 08/27/18 2015 08/28/18 0057 08/28/18 0558  TROPONINI 0.07* 0.06* 0.06*     BNP Recent Labs  Lab 08/27/18 2015  BNP 228.0*      Radiology    No results found.  Telemetry    Pt took himself off of tele  Cardiac Studies   2D Echocardiogram5.8.2019 ([place])  EF 50-55%, no rwma, mild to mod MR. Nl RV fxn. PASP 10mmHg. _____________   2D Echocardiogram2.17.2020  1. The left ventricle has  moderately reduced systolic function, with an ejection fraction of 35-40%. The cavity size was normal. Left ventricular diastology could not be evaluated secondary to atrial fibrillation. 2. The right ventricle has normal systolic function. The cavity was mildly enlarged. There is mildly increased right ventricular wall thickness. 3. Left atrial size was mildly dilated. 4. The mitral valve was not well visualized. Mild thickening of the mitral valve leaflet. 5. The tricuspid valve is not well visualized. Tricuspid valve regurgitation is mild-moderate. 6. The aortic valve was not well visualized Moderate thickening of the aortic valve Moderate calcification of the aortic valve. 7. The aortic root is normal in size and structure. 8. The inferior vena cava was dilated in size with <50% respiratory variability. 9. Right atrial pressure is estimated at 15 mmHg. 10. There is akinesis of the mid-apical anteroseptal and apical left ventricular segments. _____________   Patient Profile     78 y.o.malewith a history of chronic kidney disease, COPD, obesity, hypertension, atrial fibrillation, Parkinson's disease, and heart failure,who was admitted 2/16 with progressive dyspnea, heart failure, and afib.  Assessment & Plan    1.  Acute systolic CHF:  Echo in 03/5620 showed EF of 50-55%. PASP 34mmHg. Notes from primary care indicate that rhythm has been regular on examination. He has been having issues w/ volume dating back to last Aprilwith outpt diuretic adjustments on several occasions.  minus 1.8L yesterday, 5.3 for admission. Wt slowly coming down.  He thinks dry wt is ~ 275 lbs - currently 286.  Still volume overloaded on exam.  Transition to torsemide 40 BID as he is fairly adamant about leaving today.  Cont  blocker, hydral/nitrate. No acei/arb/arni/mra in setting of CKD and hyperkalemia.  We discussed the importance of daily weights, sodium restriction, medication compliance, and symptom  reporting and he verbalizes understanding.  Will need outpt f/u with [place] or CHF clinic w/in the next week w/ BMET @ that time (I have sent  message to our office req f/u).  2.  Persistent Afib:  Rate controlled.  Dtr says that he was cardioverted, perhaps in 2017 and that it held for awhile but at some point later, they were told that he was back in afib.   As prev noted, was reported to have regular rate and rhythm on multiple outpatient clinic visits (records on chart hard copy), thus recurrent afib may be playing a significant role in #1.  He is not interested in considering repeat dccv.  We could consider addition of amio for potential medical cardioversion (reports compliance w/ eliquis @ home), though likelihood of maintaining sinus in the long term is likely low.  Cont ? blocker and eliquis.  3.  Elevated trop:  Mild elevation w/ flat trend in setting of volume overload.  Suspect demand isch.  As above, prior echo in 11/2017 w/ nl EF. Thus LV dysfxn new this admission.  Poor candidate for nuclear testing 2/2  Body habitus, but could consider 2 day study as outpt.  Not ideal cath candidate 2/2 CKD. Cont  blocker and statin.  No ASA in setting of chronic eliquis.  4.  CKD III: Renal fxn stable.  Transitioning to torsemide 40 BID w/ plan for d/c today.  5.  Normocytic anemia:  Stable.  6.  Hyperkalemia:  Stable @ 5 this AM.  Veltassa per IM.  Signed, Murray Hodgkins, NP  09/03/2018, 7:37 AM    For questions or updates, please contact   Please consult [email or web address] for contact info under Cardiology/STEMI.   Attending Note:   The patient was seen and examined.  Agree with assessment and plan as noted above.  Changes made to the above note as needed.  Patient seen and independently examined with  Ignacia Bayley, NP .   We discussed all aspects of the encounter. I agree with the assessment and plan as stated above.  1.   Acute on chronic systolic congestive heart failure: Patient presents with  volume overload.  Had a good response to IV Lasix.  He insisted on going home today.  We discussed the fact that we would hope not to send him home too soon.  Changed him to torsemide.  I have encouraged him to elevate his legs higher than his heart.  He will return to see Dr. Saunders Revel or our APP in the next several weeks.  2.  Persistent atrial fibrillation: Patient has been in atrial fibrillation for quite some time.  He may benefit from oral amiodarone.  We will leave that to the primary cardiology team.   Continue beta-blocker and Eliquis.  3.  Troponin elevation: His trend is flat.  This is likely due to demand ischemia in the setting of congestive heart failure.  His symptom sand  troponin trends are not  consistent with acute coronary syndrome.   I have spent a total of 40 minutes with patient reviewing hospital  notes , telemetry, EKGs, labs and examining patient as well as establishing an assessment and plan that was discussed with the patient. > 50% of time was spent in direct patient care.    Thayer Headings, Brooke Bonito., MD, [facility] 09/03/2018, 10:45 AM [address],  [place] Pager [phone]

## 2018-09-03 NOTE — Progress Notes (Signed)
Patient is "high fall" due to having a fall at home within the last 6 months.  He refuses bed alarm.

## 2018-09-03 NOTE — Progress Notes (Signed)
Discharged to home with his son.  Prescriptions given.  They know to make follow up appointments.  Sent home with home oxygen tank.

## 2018-09-03 NOTE — Discharge Summary (Signed)
[facility] [place] NAME: Alex Daniel    MR#:  [id number]  DATE OF BIRTH:  04/01/1941  DATE OF ADMISSION:  08/27/2018 ADMITTING PHYSICIAN: Arta Silence, MD  DATE OF DISCHARGE:  09/03/18  PRIMARY CARE PHYSICIAN: System, Pcp Not In    ADMISSION DIAGNOSIS:  Dehydration [E86.0] Weakness [R53.1] AKI (acute kidney injury) ([place]) [N17.9] Near syncope [R55]  DISCHARGE DIAGNOSIS:  Active Problems:   SOB (shortness of breath)   SECONDARY DIAGNOSIS:   Past Medical History:  Diagnosis Date  . CKD (chronic kidney disease), stage IV ([place])   . COPD (chronic obstructive pulmonary disease) ([place])   . HFrEF (heart failure with reduced ejection fraction) ([place])    a. 08/2018 Echo: EF 35-40%, mid-apical anteroseptal and apical AK. Mildly dil RV/LA, mild to mod TR. RA pressure ~ 48mmHg.  Marland Kitchen Hypertension   . Morbid obesity ([place])   . Normocytic anemia   . PAF (paroxysmal atrial fibrillation) (H[facility])    a. CHA2DS2VASc = 4-->Eliquis.  . Parkinson disease [facility])     HOSPITAL COURSE:  HPI  Alex Daniel  is a 78 y.o. male with a known history of COPD (2L [place] home O2), reported CHF (no prior Echo results available), Afib (Eliquis) p/w exertional dyspnea, leg edema, syncope, renal failure. No prior labwork available for comparison. AAOx3. Pt endorses 80mo Hx progressively-worsening leg edema. PT saw PCP for SOB on Tuesday 02/11. Lasix increased from 40mg  PO qD to 40mg  PO BID. Pt endorses increased urinary frequency/output since then. Was walking to the bathroom @~1900PM Alex Daniel 02/16), syncope/collapse. Pt unable to get up w/ assistance, EMS called.  Appers reasonably well. 3+ B/L LE edema. States he used to work [employer], spent a lot of time on his feet, may have some degree of venous insufficiency. States his cardiolist is based out of [place], but he cannot remember the name. Pt's wife passed away ~2wks ago.  Hospital course  Acute  on chronic systolic congestive heart failure- dyspnea on exertion and lower extremity edema continue to improve. -Clinically improved with IV Lasix.  Okay to discharge patient from cardiology standpoint with torsemide 40 mg p.o. twice daily, no need of potassium supplements as the patient is hyperkalemic -Cardiology started hydralazine and Imdur -Continue metoprolol -Unable to start ACE/ARB due to renal insufficiency -Echo with EF 35-40% -TED hose ordered -Advised patient to elevate legs as much as possible -Outpatient follow-up with cardiology in 4 to 5 days and CHF clinic in 2 to 3 days Monitor daily weights, intake and output  AKI on CKD 3- creatinine is getting better -Creatinine is improving at 1.88 today  outpatient follow-up with nephrology Dr. Candiss Norse -Renal US with medical renal disease -Avoid nephrotoxic agents -Monitor.  Chronic A. fib- rate controlled -Continue Eliquis and metoprolol -Cards will consider adding amiodarone, patient would like to avoid DCCV if possible  Hyperkalemia-K 5.0.   Received V[place]   Elevated troponin- likely secondary to demand ischemia.  Troponins mildly elevated and flat no active chest pain. -Monitor  Chronic hypoxic respiratory failure secondary to COPD-on 2 L O2 at home.   No signs of acute COPD exacerbation. -Continue supplemental oxygen 2 to 3 L  Normocytic anemia- hemoglobin improved s/p 1 unit PRBC 2/20 -Transfusion threshold <8 -Anemia panel unremarkable -FOBT pending -Monitor current hemoglobin 9.2  Depression-stable -Continue vilazodone and Wellbutrin  Generalized weakness PT consult DISCHARGE CONDITIONS:   fair  CONSULTS OBTAINED:  Treatment Team:  Arta Silence, MD End, Harrell Gave,  MD   PROCEDURES   DRUG ALLERGIES:  No Known Allergies  DISCHARGE MEDICATIONS:   Allergies as of 09/03/2018   No Known Allergies     Medication List    STOP taking these medications   furosemide 40 MG  tablet Commonly known as:  LASIX   lisinopril 20 MG tablet Commonly known as:  PRINIVIL,ZESTRIL     TAKE these medications   albuterol 108 (90 Base) MCG/ACT inhaler Commonly known as:  PROVENTIL HFA;VENTOLIN HFA Inhale 2-4 puffs by mouth every 4 hours as needed for wheezing, cough, and/or shortness of breath   atorvastatin 20 MG tablet Commonly known as:  LIPITOR Take 1 tablet (20 mg total) by mouth daily at 6 PM.   buPROPion 300 MG 24 hr tablet Commonly known as:  WELLBUTRIN XL Take 300 mg by mouth daily.   carbamazepine 100 MG 12 hr tablet Commonly known as:  TEGRETOL XR Take 100 mg by mouth 2 (two) times daily.   clonazePAM 0.5 MG tablet Commonly known as:  KLONOPIN Take 0.5 mg by mouth 2 (two) times daily.   ELIQUIS 5 MG Tabs tablet Generic drug:  apixaban Take 5 mg by mouth 2 (two) times daily.   feeding supplement (ENSURE ENLIVE) Liqd Take 237 mLs by mouth 2 (two) times daily between meals.   guaiFENesin 600 MG 12 hr tablet Commonly known as:  MUCINEX Take 1 tablet (600 mg total) by mouth 2 (two) times daily as needed for to loosen phlegm.   hydrALAZINE 10 MG tablet Commonly known as:  APRESOLINE Take 1 tablet (10 mg total) by mouth every 8 (eight) hours.   isosorbide mononitrate 30 MG 24 hr tablet Commonly known as:  IMDUR Take 0.5 tablets (15 mg total) by mouth daily. Start taking on:  September 04, 2018   metoprolol succinate 50 MG 24 hr tablet Commonly known as:  TOPROL-XL Take 50 mg by mouth daily.   montelukast 10 MG tablet Commonly known as:  SINGULAIR Take 10 mg by mouth every evening.   multivitamin with minerals Tabs tablet Take 1 tablet by mouth daily. Start taking on:  September 04, 2018   polyethylene glycol packet Commonly known as:  MIRALAX / GLYCOLAX Take 17 g by mouth daily. Start taking on:  September 04, 2018   senna-docusate 8.6-50 MG tablet Commonly known as:  Senokot-S Take 1 tablet by mouth at bedtime as needed for mild  constipation.   tamsulosin 0.4 MG Caps capsule Commonly known as:  FLOMAX Take 0.4 mg by mouth daily.   torsemide 20 MG tablet Commonly known as:  DEMADEX Take 2 tablets (40 mg total) by mouth 2 (two) times daily.   VIIBRYD 40 MG Tabs Generic drug:  Vilazodone HCl Take 40 mg by mouth daily.        DISCHARGE INSTRUCTIONS:  Follow-up with primary care physician in 3 days Follow-up with CHF clinic in 2 days Daily weight monitoring, intake and output Follow-up with nephrology Dr. Candiss Norse in 1 to 2 weeks    DIET:  Cardiac diet  DISCHARGE CONDITION:  Fair  ACTIVITY:  Activity as tolerated  OXYGEN:  Home Oxygen: Yes.     Oxygen Delivery: 2-3 liters/min via Patient connected to nasal cannula oxygen  DISCHARGE LOCATION:  home   If you experience worsening of your admission symptoms, develop shortness of breath, life threatening emergency, suicidal or homicidal thoughts you must seek medical attention immediately by calling 911 or calling your MD immediately  if symptoms less severe.  You Must read complete instructions/literature along with all the possible adverse reactions/side effects for all the Medicines you take and that have been prescribed to you. Take any new Medicines after you have completely understood and accpet all the possible adverse reactions/side effects.   Please note  You were cared for by a hospitalist during your hospital stay. If you have any questions about your discharge medications or the care you received while you were in the hospital after you are discharged, you can call the unit and asked to speak with the hospitalist on call if the hospitalist that took care of you is not available. Once you are discharged, your primary care physician will handle any further medical issues. Please note that NO REFILLS for any discharge medications will be authorized once you are discharged, as it is imperative that you return to your primary care physician (or  establish a relationship with a primary care physician if you do not have one) for your aftercare needs so that they can reassess your need for medications and monitor your lab values.     Today  Chief Complaint  Patient presents with  . Fall  . Weakness   Patient is feeling much better shortness of breath improved okay to discharge patient from cardiology standpoint and nephrology standpoint.  Patient is agreeable.  Family members at bedside  ROS:  CONSTITUTIONAL: Denies fevers, chills. Denies any fatigue, weakness.  EYES: Denies blurry vision, double vision, eye pain. EARS, NOSE, THROAT: Denies tinnitus, ear pain, hearing loss. RESPIRATORY: Denies cough, wheeze, shortness of breath.  CARDIOVASCULAR: Denies chest pain, palpitations, edema.  GASTROINTESTINAL: Denies nausea, vomiting, diarrhea, abdominal pain. Denies bright red blood per rectum. GENITOURINARY: Denies dysuria, hematuria. ENDOCRINE: Denies nocturia or thyroid problems. HEMATOLOGIC AND LYMPHATIC: Denies easy bruising or bleeding. SKIN: Denies rash or lesion. MUSCULOSKELETAL: Denies pain in neck, back, shoulder, knees, hips or arthritic symptoms.  NEUROLOGIC: Denies paralysis, paresthesias.  PSYCHIATRIC: Denies anxiety or depressive symptoms.   VITAL SIGNS:  Blood pressure (!) 151/93, pulse 88, temperature 97.9 F (36.6 C), temperature source Oral, resp. rate (!) 22, height 5\' 11"  (1.803 m), weight 129.4 kg, SpO2 99 %.  I/O:    Intake/Output Summary (Last 24 hours) at 09/03/2018 1327 Last data filed at 09/02/2018 2051 Gross per 24 hour  Intake -  Output 900 ml  Net -900 ml    PHYSICAL EXAMINATION:  GENERAL:  78 y.o.-year-old patient lying in the bed with no acute distress.  EYES: Pupils equal, round, reactive to light and accommodation. No scleral icterus. Extraocular muscles intact.  HEENT: Head atraumatic, normocephalic. Oropharynx and nasopharynx clear.  NECK:  Supple, no jugular venous distention. No  thyroid enlargement, no tenderness.  LUNGS: Normal breath sounds bilaterally, no wheezing, rales,rhonchi or crepitation. No use of accessory muscles of respiration.  CARDIOVASCULAR: S1, S2 normal. No murmurs, rubs, or gallops.  ABDOMEN: Soft, non-tender, non-distended. Bowel sounds present.  EXTREMITIES: Still has pedal edema but significantly improved, cyanosis, or clubbing.  NEUROLOGIC: Awake, alert and oriented x3. Sensation intact. Gait not checked.  PSYCHIATRIC: The patient is alert and oriented x 3.  SKIN: No obvious rash, lesion, or ulcer.   DATA REVIEW:   CBC Recent Labs  Lab 09/02/18 0444 09/03/18 0514  WBC 13.6*  --   HGB 9.2* 8.9*  HCT 30.6* 29.1*  PLT 304  --     Chemistries  Recent Labs  Lab 08/27/18 2015  09/03/18 0513  NA 132*   < > 140  K 5.0   < >  5.0  CL 93*   < > 96*  CO2 31   < > 37*  GLUCOSE 120*   < > 115*  BUN 66*   < > 71*  CREATININE 2.44*   < > 1.88*  CALCIUM 8.4*   < > 9.0  MG 2.6*  --   --   AST 30  --   --   ALT 30  --   --   ALKPHOS 54  --   --   BILITOT 0.4  --   --    < > = values in this interval not displayed.    Cardiac Enzymes Recent Labs  Lab 08/28/18 0558  TROPONINI 0.06*    Microbiology Results  No results found for this or any previous visit.  RADIOLOGY:  Dg Chest 1 View  Result Date: 08/31/2018 CLINICAL DATA:  Shortness of breath. Parkinson's disease. EXAM: CHEST  1 VIEW COMPARISON:  08/29/2018. FINDINGS: Low lung volumes. Cardiomegaly. Moderate vascular congestion, with BILATERAL RIGHT greater than LEFT pleural effusions. Early pulmonary edema not excluded. IMPRESSION: Cardiomegaly with vascular congestion and BILATERAL effusions. Early pulmonary edema not excluded. For Electronically Signed   By: Staci Righter M.D.   On: 08/31/2018 09:02    EKG:   Orders placed or performed during the hospital encounter of 08/27/18  . EKG 12-Lead  . EKG 12-Lead  . ED EKG  . ED EKG      Management plans discussed with the  patient, family and they are in agreement.  CODE STATUS:     Code Status Orders  (From admission, onward)         Start     Ordered   08/28/18 0146  Full code  Continuous     08/28/18 0145        Code Status History    This patient has a current code status but no historical code status.      TOTAL TIME TAKING CARE OF THIS PATIENT: 43  minutes.   Note: This dictation was prepared with Dragon dictation along with smaller phrase technology. Any transcriptional errors that result from this process are unintentional.   @[facility] @  on 09/03/2018 at 1:27 PM  Between 7am to 6pm - Pager - [phone]  After 6pm go to [email or web address] - password [email or web address] [facility]  Office  [phone]  CC: Primary care physician; System, Pcp Not In

## 2018-09-05 ENCOUNTER — Encounter: Payer: Self-pay | Admitting: Cardiovascular Disease

## 2018-09-05 ENCOUNTER — Ambulatory Visit (INDEPENDENT_AMBULATORY_CARE_PROVIDER_SITE_OTHER): Payer: Medicare Other | Admitting: Cardiovascular Disease

## 2018-09-05 ENCOUNTER — Encounter

## 2018-09-05 VITALS — BP 132/66 | HR 97 | Ht 71.0 in | Wt 284.0 lb

## 2018-09-05 DIAGNOSIS — I4891 Unspecified atrial fibrillation: Secondary | ICD-10-CM

## 2018-09-05 DIAGNOSIS — I5022 Chronic systolic (congestive) heart failure: Secondary | ICD-10-CM | POA: Diagnosis not present

## 2018-09-05 DIAGNOSIS — Z01818 Encounter for other preprocedural examination: Secondary | ICD-10-CM | POA: Diagnosis not present

## 2018-09-05 NOTE — Patient Instructions (Addendum)
Medication Instructions:  No changes If you need a refill on your cardiac medications before your next appointment, please call your pharmacy.   Lab work: Your provider would like for you to have the following labs today: CBC and BMET  If you have labs (blood work) drawn today and your tests are completely normal, you will receive your results only by: Marland Kitchen MyChart Message (if you have MyChart) OR . A paper copy in the mail If you have any lab test that is abnormal or we need to change your treatment, we will call you to review the results.  Testing/Procedures: You have been scheduled for a cardioversion on Monday 09/11/2018  Follow-Up: At [facility], you and your health needs are our priority.  As part of our continuing mission to provide you with exceptional heart care, we have created designated Provider Care Teams.  These Care Teams include your primary Cardiologist (physician) and Advanced Practice Providers (APPs -  Physician Assistants and Nurse Practitioners) who all work together to provide you with the care you need, when you need it. You will need a follow up appointment in 1 month. You may see Dr. Fletcher Anon or one of the following Advanced Practice Providers on your designated Care Team:   Murray Hodgkins, NP Christell Faith, PA-C . Marrianne Mood, PA-C   Any Other Special Instructions Will Be Listed Below (If Applicable). Dear Alex Daniel, You are scheduled for a Cardioversion on 09/11/2018 with Dr. Fletcher Anon.  Please arrive at [facility] at 6:30. You can check in at the first desk on the right.  DIET: Nothing to eat or drink after midnight except a sip of water with medications (see medication instructions below)  Medication Instructions: Continue your anticoagulant: Eliquis You will need to continue your anticoagulant after your procedure until you  are told by your  Provider that it is safe to stop   Labs: Completed 09/05/2018  You must have a responsible person to drive  you home and stay in the waiting area during your procedure. Failure to do so could result in cancellation.  Bring your insurance cards.  *Special Note: Every effort is made to have your procedure done on time. Occasionally there are emergencies that occur at the hospital that may cause delays. Please be patient if a delay does occur.

## 2018-09-05 NOTE — Progress Notes (Signed)
Cardiology Office Note   Date:  09/05/2018   ID:  Alex Daniel, DOB 1941-02-27, MRN [id number]  PCP:  [facility] [place]  Cardiologist:   Kathlyn Sacramento, MD   Chief Complaint  Patient presents with  . Other    Hospital follow up for swelling ,SOB, and near syncope. Patient c/o sob and swelling in legs. Meds reviewed verbally with patient.       History of Present Illness: Alex Daniel is a 78 y.o. male who presents for a follow-up visit after recent hospitalization at [facility] with heart failure.  He has known history of chronic kidney disease, COPD, obesity, hypertension, atrial fibrillation, questionable Parkinson's disease and recent heart failure. He was previously followed in [place] and I was able to obtain his cardiac records.  It appears that he was diagnosed with atrial fibrillation in 2018 but was never cardioverted.  His echocardiogram at that time showed an EF of 50 to 55%.  He was treated with rate control and anticoagulation. The patient started having worsening of exertional dyspnea in 2019.  Unfortunately, he lost his wife last month following a long battle with cancer.  He has been staying here in [place] with his daughter.  He had worsening exertional dyspnea, weakness and orthopnea.  He also noted worsening leg edema.  He came to the emergency room at [facility] where he was found to have a BNP of 228.  Troponin was mildly elevated.  Creatinine was 2.44.  His baseline is unknown but chronic kidney disease is listed on his past medical history from his cardiac records.  He had an echocardiogram done while hospitalized which showed an EF of 35 to 40%.  The patient improved with diuresis and was discharged home on torsemide. He reports improvement in shortness of breath but he continues to be very limited.  He continues to have significant leg edema.  No chest discomfort.   Past Medical History:  Diagnosis Date  . CKD (chronic kidney disease), stage IV ([place])   .  COPD (chronic obstructive pulmonary disease) ([place])   . HFrEF (heart failure with reduced ejection fraction) ([place])    a. 08/2018 Echo: EF 35-40%, mid-apical anteroseptal and apical AK. Mildly dil RV/LA, mild to mod TR. RA pressure ~ 27mmHg.  Marland Kitchen Hypertension   . Morbid obesity ([place])   . Normocytic anemia   . PAF (paroxysmal atrial fibrillation) ([facility])    a. CHA2DS2VASc = 4-->Eliquis.  . Parkinson disease [facility])     History reviewed. No pertinent surgical history.   Current Outpatient Medications  Medication Sig Dispense Refill  . albuterol (PROVENTIL HFA;VENTOLIN HFA) 108 (90 Base) MCG/ACT inhaler Inhale 2-4 puffs by mouth every 4 hours as needed for wheezing, cough, and/or shortness of breath 1 Inhaler 1  . atorvastatin (LIPITOR) 20 MG tablet Take 1 tablet (20 mg total) by mouth daily at 6 PM. 30 tablet 0  . buPROPion (WELLBUTRIN XL) 300 MG 24 hr tablet Take 300 mg by mouth daily.    . carbamazepine (TEGRETOL XR) 100 MG 12 hr tablet Take 100 mg by mouth 2 (two) times daily.    . clonazePAM (KLONOPIN) 0.5 MG tablet Take 0.5 mg by mouth 2 (two) times daily.    Marland Kitchen ELIQUIS 5 MG TABS tablet Take 5 mg by mouth 2 (two) times daily.    . feeding supplement, ENSURE ENLIVE, (ENSURE ENLIVE) LIQD Take 237 mLs by mouth 2 (two) times daily between meals. 60 Bottle 0  . guaiFENesin (MUCINEX) 600 MG  12 hr tablet Take 1 tablet (600 mg total) by mouth 2 (two) times daily as needed for to loosen phlegm.    . hydrALAZINE (APRESOLINE) 10 MG tablet Take 1 tablet (10 mg total) by mouth every 8 (eight) hours. 90 tablet 0  . isosorbide mononitrate (IMDUR) 30 MG 24 hr tablet Take 0.5 tablets (15 mg total) by mouth daily. 30 tablet 0  . metoprolol succinate (TOPROL-XL) 50 MG 24 hr tablet Take 50 mg by mouth daily.    . montelukast (SINGULAIR) 10 MG tablet Take 10 mg by mouth every evening.    . Multiple Vitamin (MULTIVITAMIN WITH MINERALS) TABS tablet Take 1 tablet by mouth daily.    . polyethylene glycol (MIRALAX /  GLYCOLAX) packet Take 17 g by mouth daily. 14 each 0  . senna-docusate (SENOKOT-S) 8.6-50 MG tablet Take 1 tablet by mouth at bedtime as needed for mild constipation.    . tamsulosin (FLOMAX) 0.4 MG CAPS capsule Take 0.4 mg by mouth daily.    Marland Kitchen torsemide (DEMADEX) 20 MG tablet Take 2 tablets (40 mg total) by mouth 2 (two) times daily. 60 tablet 0  . VIIBRYD 40 MG TABS Take 40 mg by mouth daily.     No current facility-administered medications for this visit.     Allergies:   Patient has no known allergies.    Social History:  The patient  reports that he has never smoked. He has never used smokeless tobacco. He reports previous alcohol use. He reports that he does not use drugs.   Family History:  The patient's family history is not on file.    ROS:  Please see the history of present illness.   Otherwise, review of systems are positive for none.   All other systems are reviewed and negative.    PHYSICAL EXAM: VS:  BP 132/66 (BP Location: Right Arm, Patient Position: Sitting, Cuff Size: Normal)   Pulse 97   Ht 5\' 11"  (1.803 m)   Wt 284 lb (128.8 kg)   BMI 39.61 kg/m  , BMI Body mass index is 39.61 kg/m. GEN: Well nourished, well developed, in no acute distress  HEENT: normal  Neck: Mild JVD, carotid bruits, or masses Cardiac: Irregularly irregular; no murmurs, rubs, or gallops, moderate bilateral leg edema Respiratory:  clear to auscultation bilaterally, normal work of breathing GI: soft, nontender, nondistended, + BS MS: no deformity or atrophy  Skin: warm and dry, no rash Neuro:  Strength and sensation are intact Psych: euthymic mood, full affect   EKG:  EKG is ordered today. The ekg ordered today demonstrates atrial fibrillation with ventricular rate of 97 bpm.  Low voltage and nonspecific ST and T wave changes.   Recent Labs: 08/27/2018: ALT 30; B Natriuretic Peptide 228.0; Magnesium 2.6 09/02/2018: Platelets 304 09/03/2018: BUN 71; Creatinine, Ser 1.88; Hemoglobin 8.9;  Potassium 5.0; Sodium 140    Lipid Panel    Component Value Date/Time   CHOL 115 08/31/2018 0334   TRIG 52 08/31/2018 0334   HDL 37 (L) 08/31/2018 0334   CHOLHDL 3.1 08/31/2018 0334   VLDL 10 08/31/2018 0334   LDLCALC 68 08/31/2018 0334      Wt Readings from Last 3 Encounters:  09/05/18 284 lb (128.8 kg)  09/03/18 285 lb 4.8 oz (129.4 kg)  10/26/17 280 lb (127 kg)      No flowsheet data found.    ASSESSMENT AND PLAN:  1.  Chronic systolic heart failure: The patient was recently diagnosed with systolic heart  failure with an EF of 35 to 40%.  He continues to be volume overloaded but he might have been taking torsemide 20 mg twice daily instead of 40 mg twice daily.  I asked his daughter to verify the dose and to make sure he is taking 40 mg twice daily as he continues to be at least 10 pounds above his dry weight with significant leg edema.  His diuresis is somewhat limited by underlying chronic kidney disease.  Continue Toprol and a combination of Imdur and hydralazine given that were not able to use ACE inhibitors or ARB's. Given his systolic heart failure, he will ultimately require ischemic cardiac evaluation likely with cardiac catheterization but I would like to see improvement in his kidney function first.  2.  Atrial fibrillation: It appears that he has been on this since 2018 with no previous attempted cardioversion.  His left atrium was only mildly dilated.  Given worsening heart failure, I think it is worth trying to pursue a rhythm control strategy.  It is possible that this might not work without an antiarrhythmic medication such as amiodarone but I would like to at least attempt once without an antiarrhythmic medication.  He has been on anticoagulation with Eliquis and has not missed any dose.    Disposition:   FU with me in 1 month  Signed,  Kathlyn Sacramento, MD  09/05/2018 2:10 PM    [place]

## 2018-09-06 LAB — BASIC METABOLIC PANEL
BUN/Creatinine Ratio: 38 — ABNORMAL HIGH (ref 10–24)
BUN: 73 mg/dL — ABNORMAL HIGH (ref 8–27)
CO2: 32 mmol/L — ABNORMAL HIGH (ref 20–29)
Calcium: 9.2 mg/dL (ref 8.6–10.2)
Chloride: 91 mmol/L — ABNORMAL LOW (ref 96–106)
Creatinine, Ser: 1.92 mg/dL — ABNORMAL HIGH (ref 0.76–1.27)
GFR calc Af Amer: 39 mL/min/{1.73_m2} — ABNORMAL LOW (ref >59)
GFR calc non Af Amer: 33 mL/min/{1.73_m2} — ABNORMAL LOW (ref >59)
Glucose: 121 mg/dL — ABNORMAL HIGH (ref 65–99)
Potassium: 4.9 mmol/L (ref 3.5–5.2)
Sodium: 139 mmol/L (ref 134–144)

## 2018-09-06 LAB — CBC
Hematocrit: 25.2 % — ABNORMAL LOW (ref 37.5–51.0)
Hemoglobin: 8.3 g/dL — ABNORMAL LOW (ref 13.0–17.7)
MCH: 29.6 pg (ref 26.6–33.0)
MCHC: 32.9 g/dL (ref 31.5–35.7)
MCV: 90 fL (ref 79–97)
PLATELETS: 266 10*3/uL (ref 150–450)
RBC: 2.8 x10E6/uL — ABNORMAL LOW (ref 4.14–5.80)
RDW: 14.2 % (ref 11.6–15.4)
WBC: 13.4 10*3/uL — AB (ref 3.4–10.8)

## 2018-09-11 ENCOUNTER — Encounter: Payer: Self-pay | Admitting: Anesthesiology

## 2018-09-11 ENCOUNTER — Ambulatory Visit: Payer: Medicare Other | Admitting: Family

## 2018-09-11 ENCOUNTER — Ambulatory Visit: Admission: RE | Admit: 2018-09-11 | Payer: Medicare Other | Source: Home / Self Care | Admitting: Cardiovascular Disease

## 2018-09-11 SURGERY — CARDIOVERSION (CATH LAB)
Anesthesia: General

## 2018-09-11 MED ORDER — PROPOFOL 10 MG/ML IV BOLUS
INTRAVENOUS | Status: AC
  Filled 2018-09-11: qty 20

## 2018-09-14 NOTE — Progress Notes (Deleted)
   Patient ID: Kanai Berrios, male    DOB: [birth year], 78 y.o.   MRN: [id number]  HPI  Mr Ruta is a 78 y/o male with a history of   Echo report from 08/28/2018 reviewed and showed an EF of 35-40% along with mild/moderate TR.  Admitted 08/27/2018 due to acute on chronic HF. Nephrology and cardiology consults obtained. Initially given IV lasix and then transitioned to oral diuretics. Elevated troponin thought to be due to demand ischemia. Discharged after 7 days.  He presents today for his initial visit with a chief complaint of   Review of Systems    Physical Exam    Assessment & Plan:  1: Chronic heart failure with reduced ejection fraction- - NYHA class - due to renal disease, unable to use ACE-I/ ARB - BNP 08/27/2018 was 228.0  2: HTN- - BP - BMP from 09/05/2018 reviewed and showed sodium 139, potassium 4.9, creatinine 1.92 and GFR 33  3: Atrial fibrillation- - ? cardioverted

## 2018-09-18 ENCOUNTER — Ambulatory Visit: Payer: Medicare Other | Admitting: Family

## 2018-10-04 ENCOUNTER — Ambulatory Visit: Payer: Medicare Other | Admitting: Physician Assistant

## 2018-10-05 ENCOUNTER — Ambulatory Visit: Payer: Medicare Other | Admitting: Physician Assistant

## 2018-10-11 DEATH — deceased

## 2019-04-08 IMAGING — US US RENAL
1 series · 14 of 25 positions shown · non-contrast
Comparison: None.

CLINICAL DATA: Acute kidney injury

EXAM:
RENAL / URINARY TRACT ULTRASOUND COMPLETE

[Series 1: us renal · 14 of 34 slices shown]
[im 1/34]
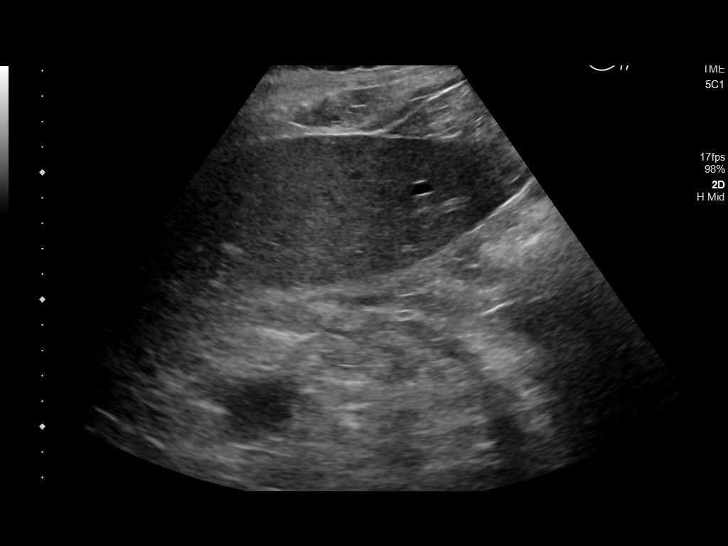
[im 3/34]
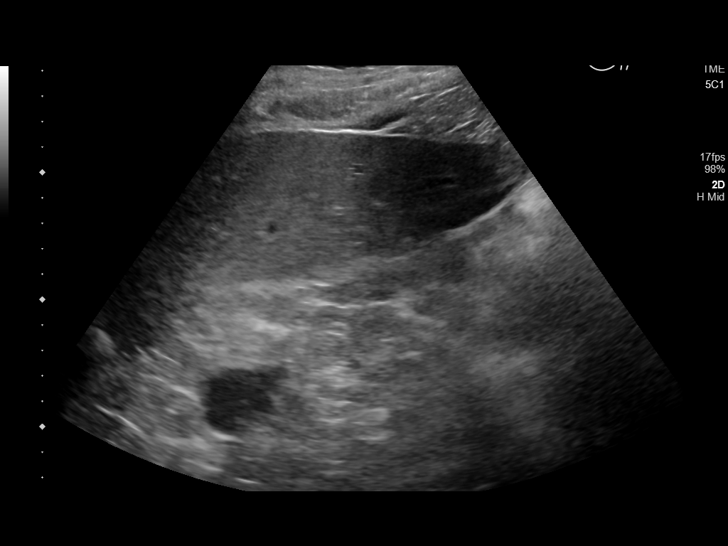
[im 6/34]
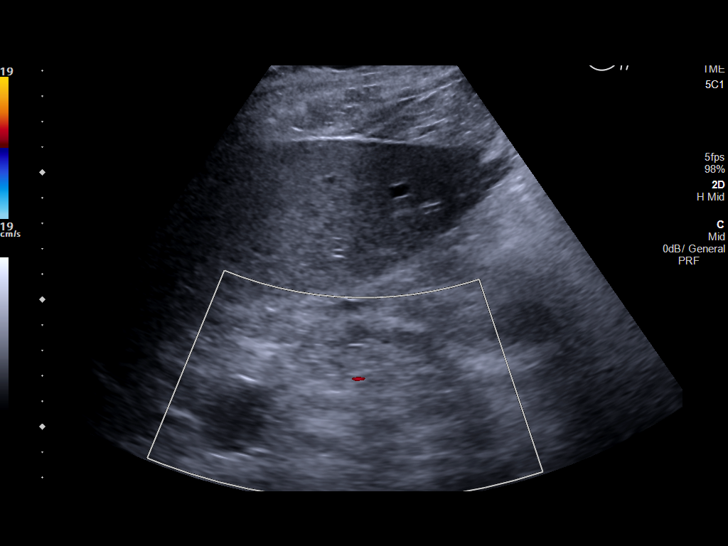
[im 9/34]
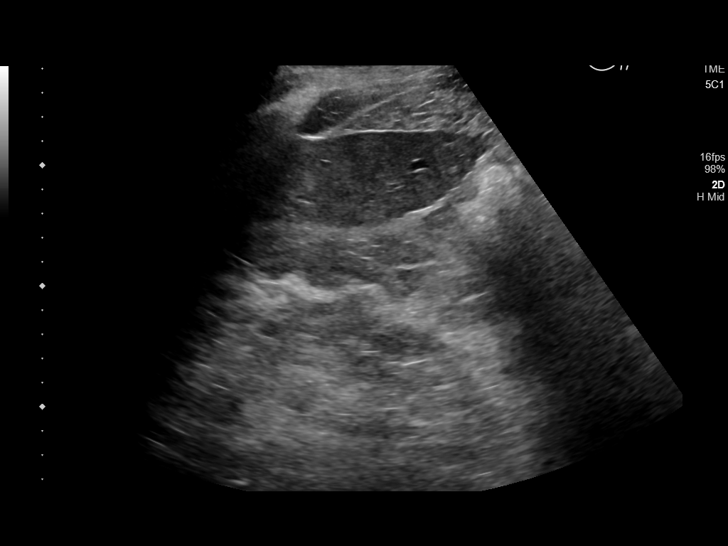
[im 12/34]
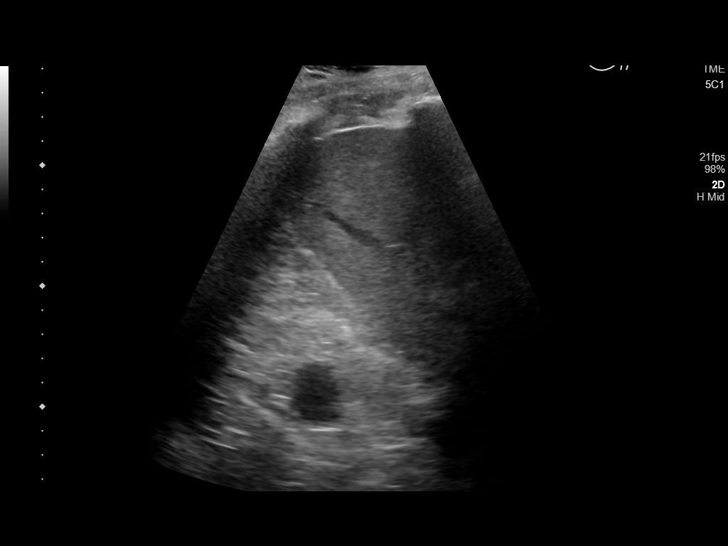
[im 13/34]
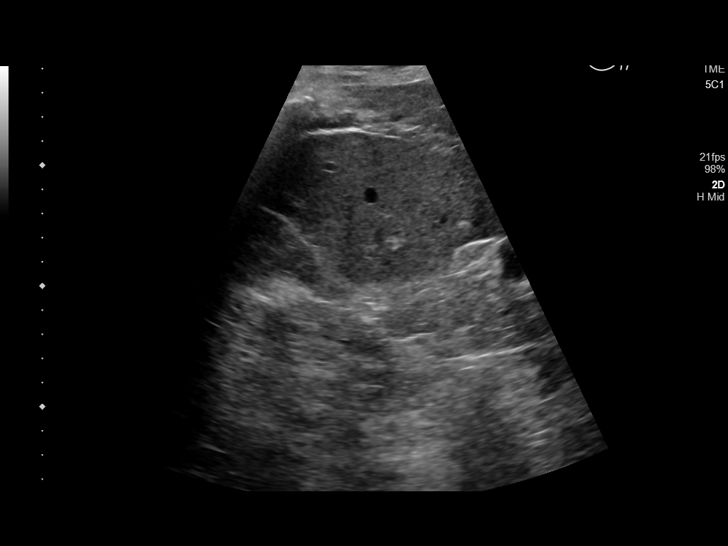
[im 16/34]
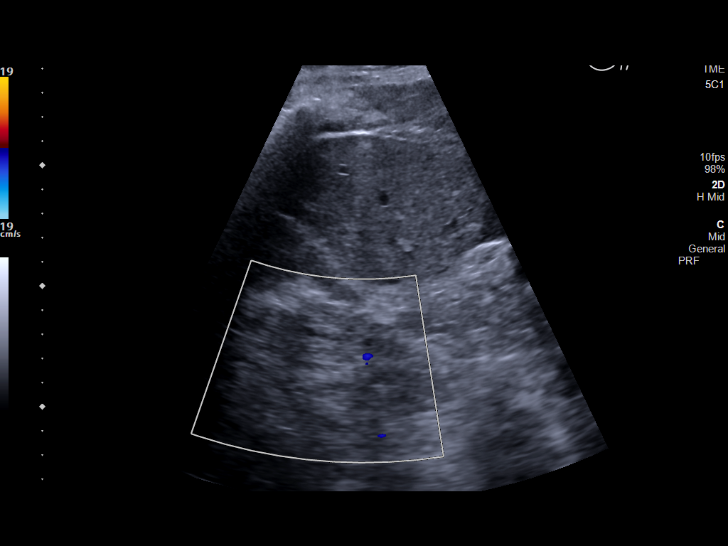
[im 18/34]
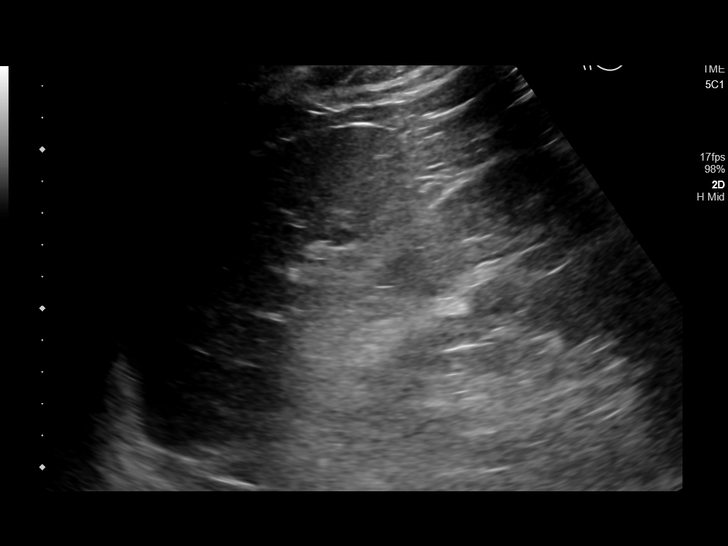
[im 21/34]
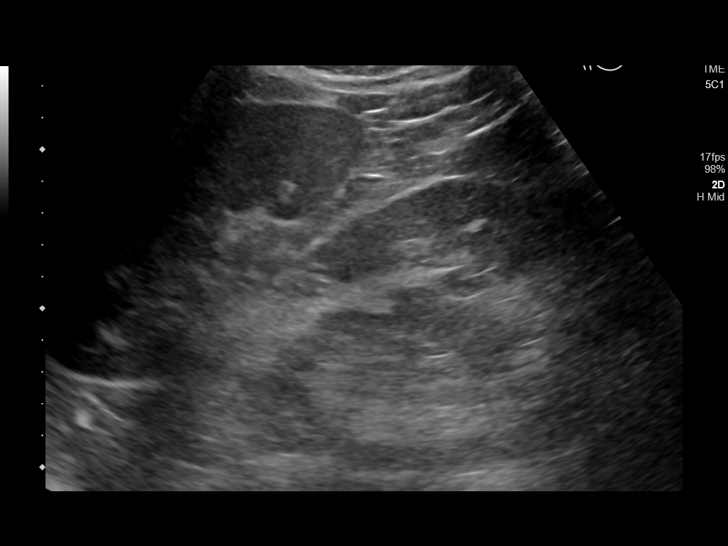
[im 23/34]
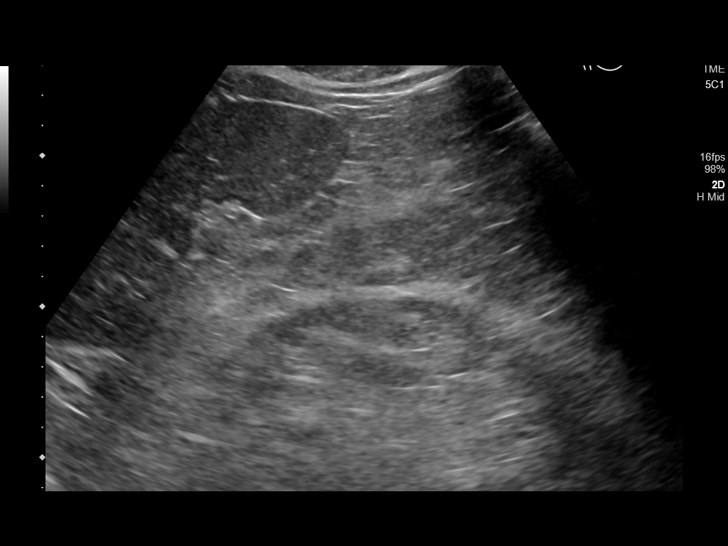
[im 25/34]
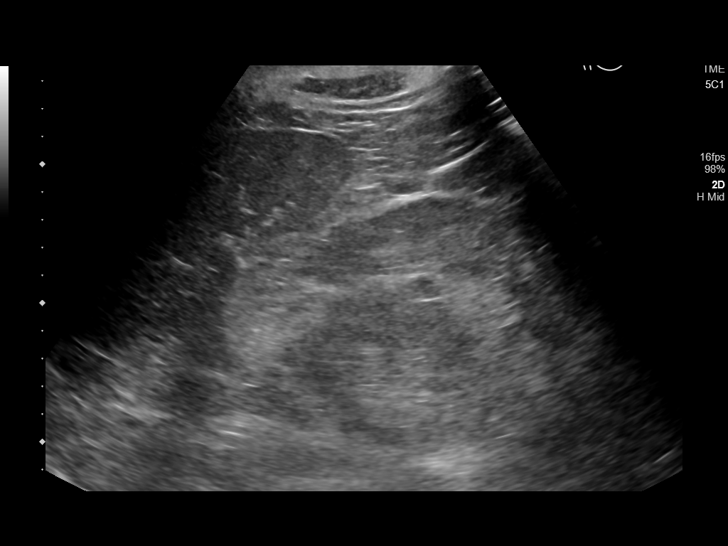
[im 28/34]
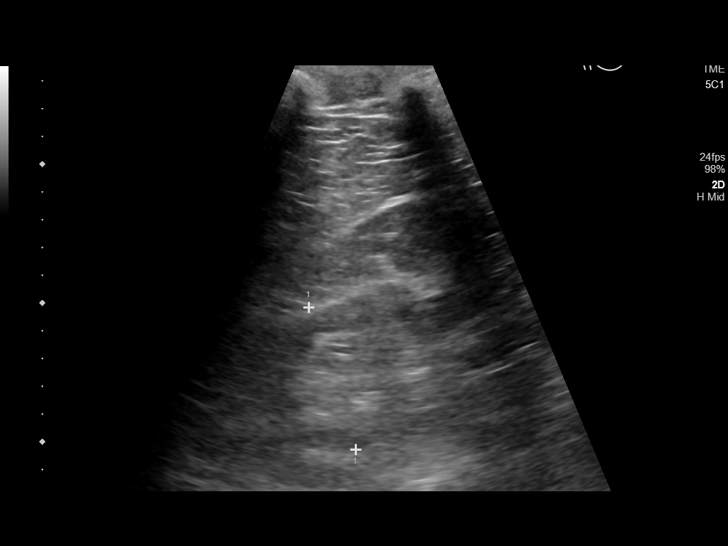
[im 31/34]
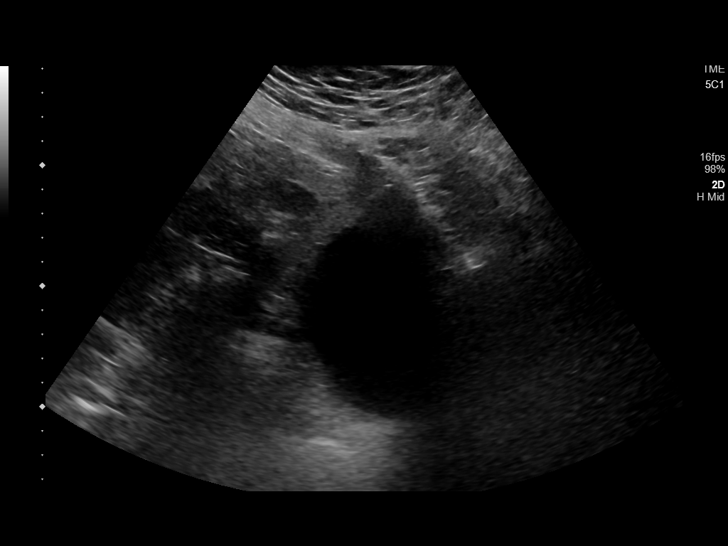
[im 34/34]
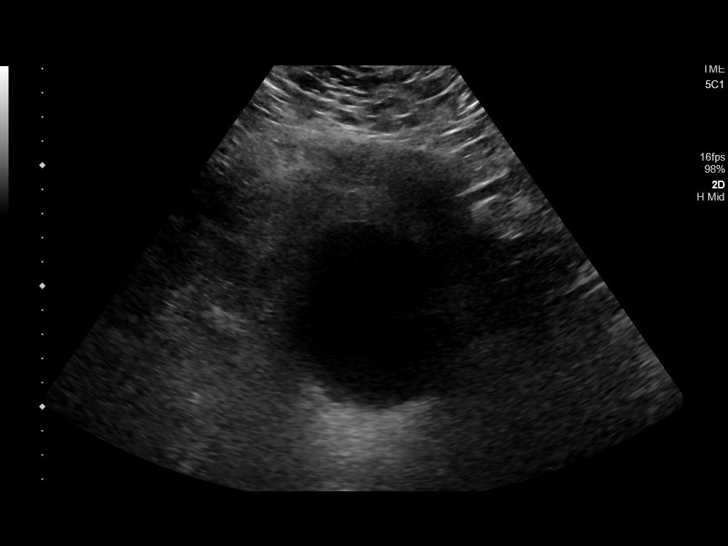

[14 of 25 positions shown; findings below may reference images not displayed]

FINDINGS: Right Kidney:

Renal measurements: 10 x 6 x 5 cm. Cortical thinning and increased
echogenicity. Upper pole cyst measuring 2.7 cm. No hydronephrosis

Left Kidney:

Renal measurements: 10 x 6 x 5 cm. Cortical thinning and increased
echogenicity. No hydronephrosis.

Bladder:

Appears normal for degree of bladder distention.
IMPRESSION: 1. Medical renal disease.
2. No hydronephrosis.

## 2019-04-09 IMAGING — DX DG CHEST 1V
1 series · 1 of 1 positions shown · non-contrast
Comparison: 08/27/2018

CLINICAL DATA: 77-year-old male with a history of shortness of
breath

EXAM:
CHEST  1 VIEW

[chest ap]
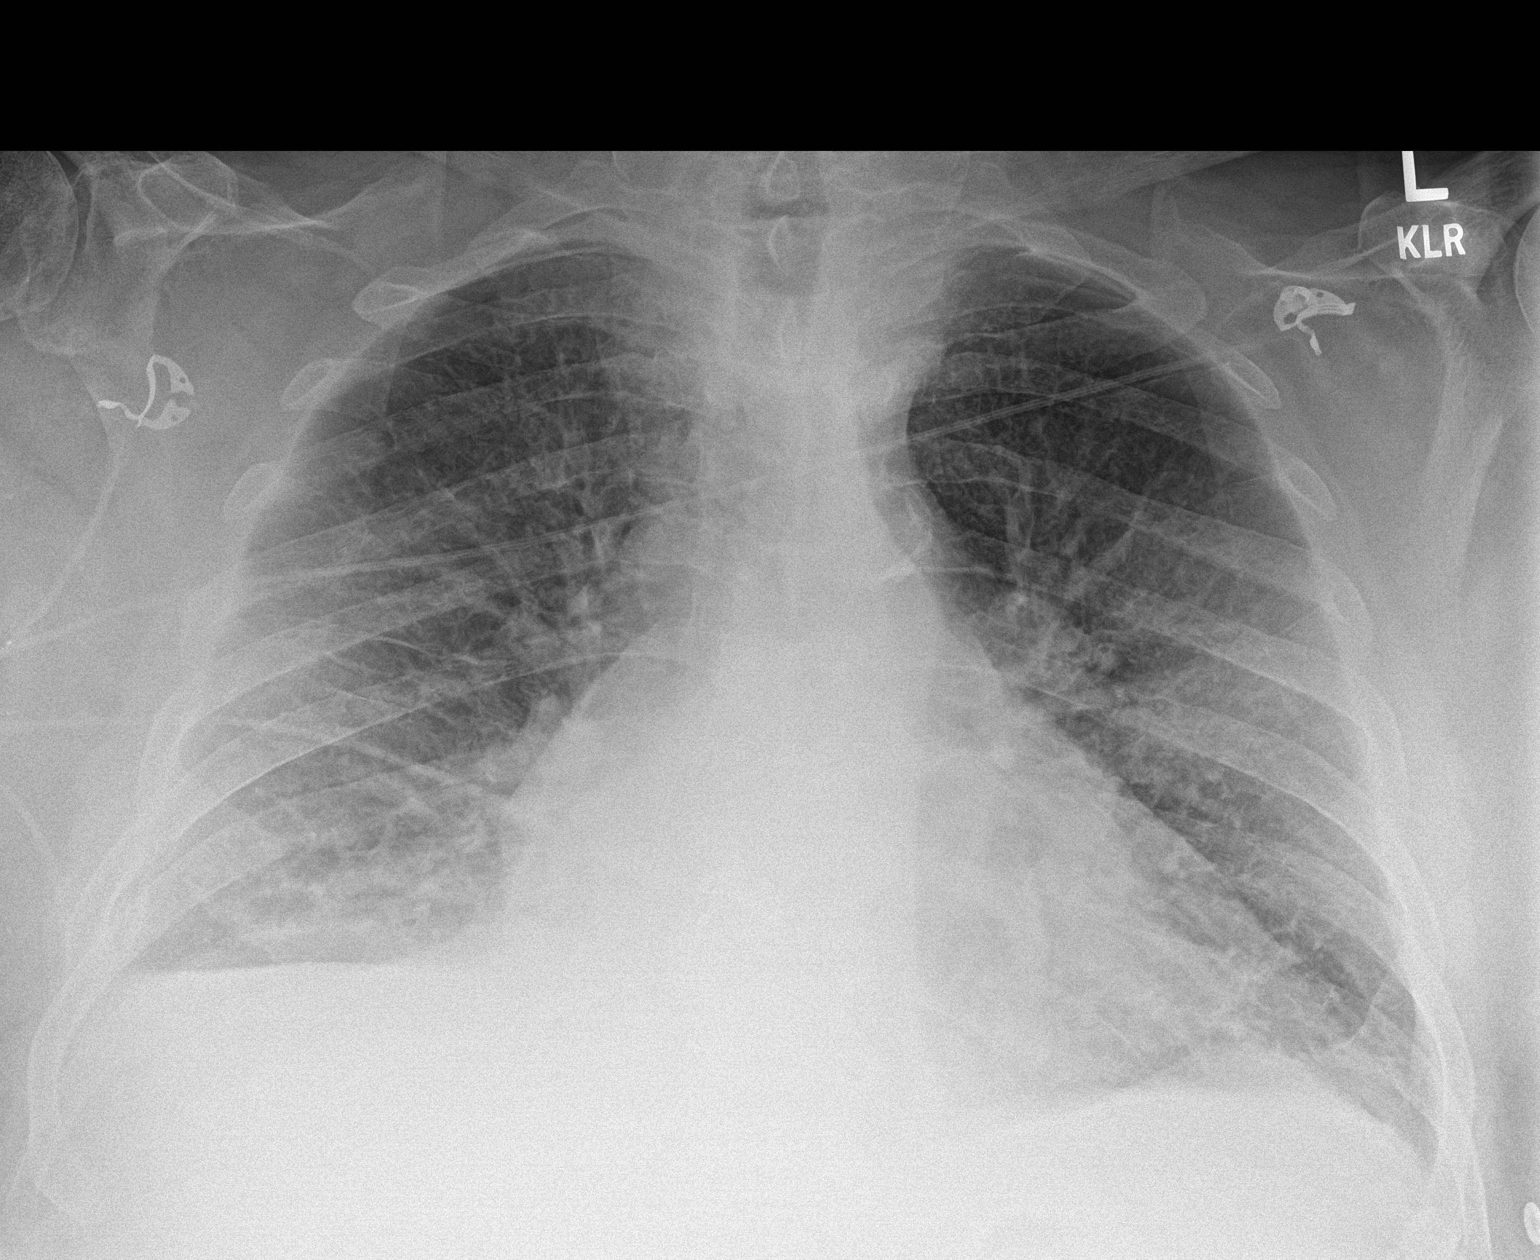

[1 of 1 positions shown; findings below may reference images not displayed]

FINDINGS: Cardiomediastinal silhouette unchanged in size and contour with
cardiomegaly. Interlobular septal thickening with reticulonodular
opacities bilateral lungs. No pneumothorax. Blunting of the
costophrenic angles compatible with pleural effusion.

No displaced fracture
IMPRESSION: Similar appearance of low lung volumes and reticulonodular opacity
of the lungs, potentially combination of edema, infection,
atelectasis/scarring, and small pleural effusions.

## 2019-04-09 IMAGING — US VENOUS DOPPLER ULTRASOUND OF LEFT LOWER EXTREMITY
1 series · 14 of 24 positions shown · non-contrast
Comparison: None

CLINICAL DATA: Edema x3 weeks

EXAM:
LEFT LOWER EXTREMITY VENOUS DOPPLER ULTRASOUND
TECHNIQUE: Gray-scale sonography with compression, as well as color and duplex
ultrasound, were performed to evaluate the deep venous system from
the level of the common femoral vein through the popliteal and
proximal calf veins.

[Series 1: venous doppler ultrasound of left lower extremity · 0.09mm/px · 14 of 36 slices shown]
[im 1/36]
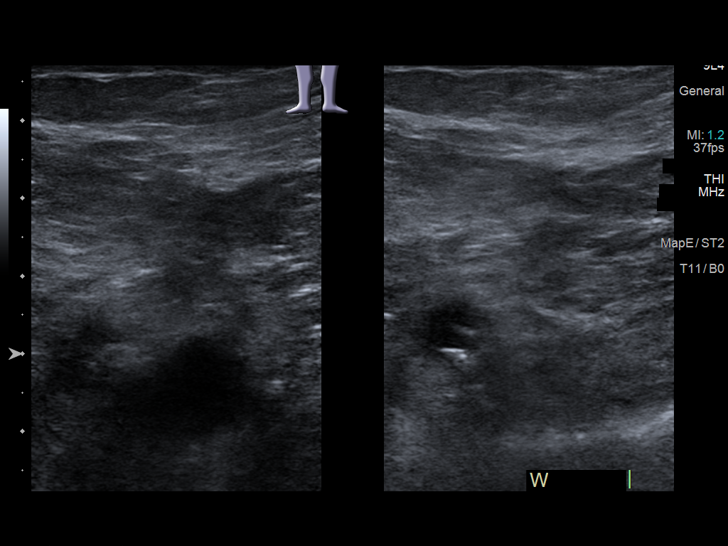
[im 4/36]
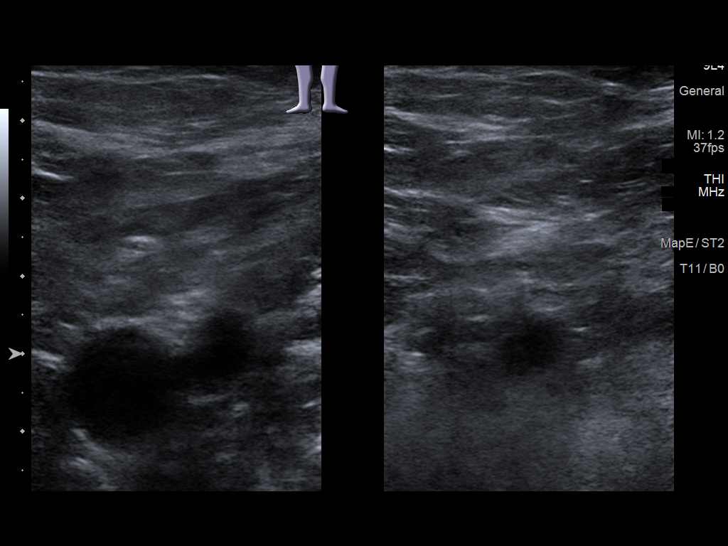
[im 7/36]
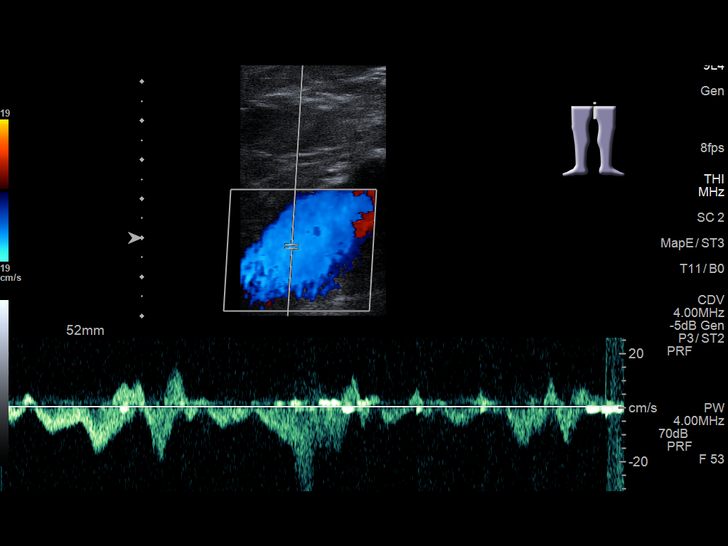
[im 10/36]
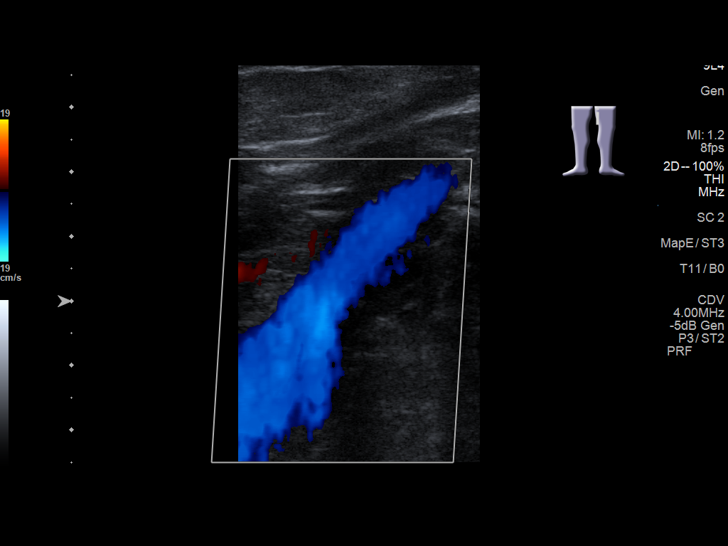
[im 11/36]
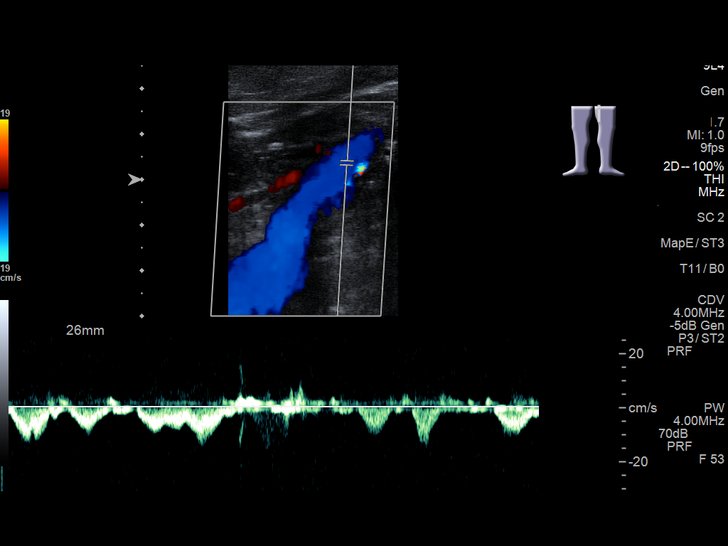
[im 14/36]
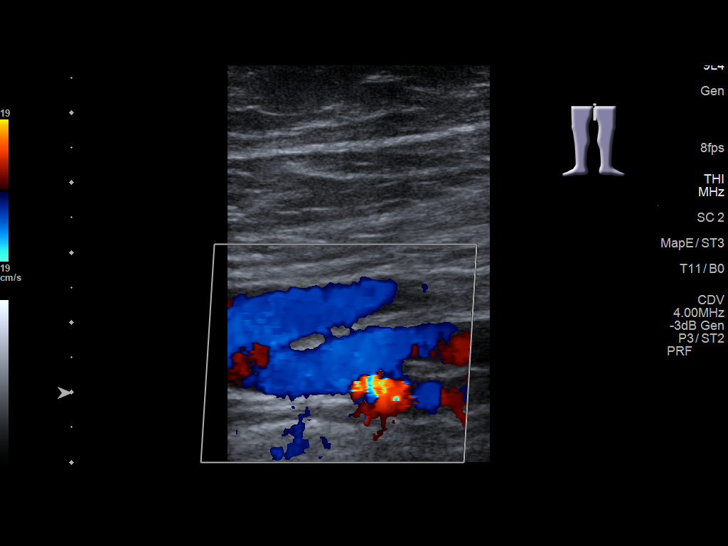
[im 17/36]
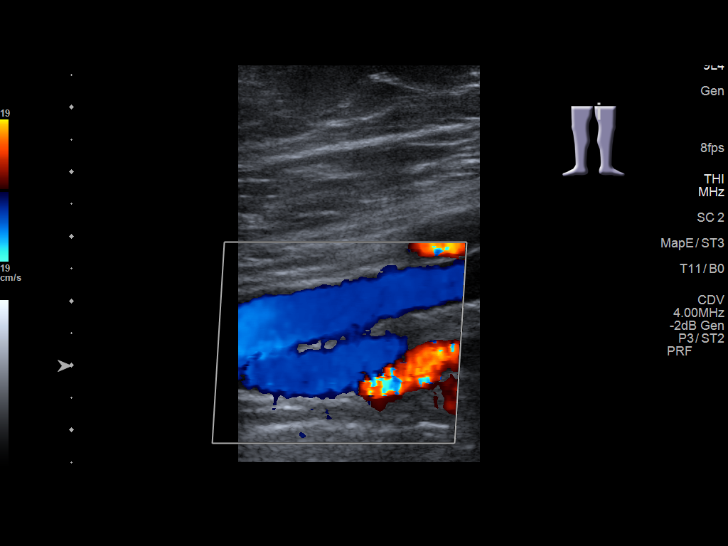
[im 19/36]
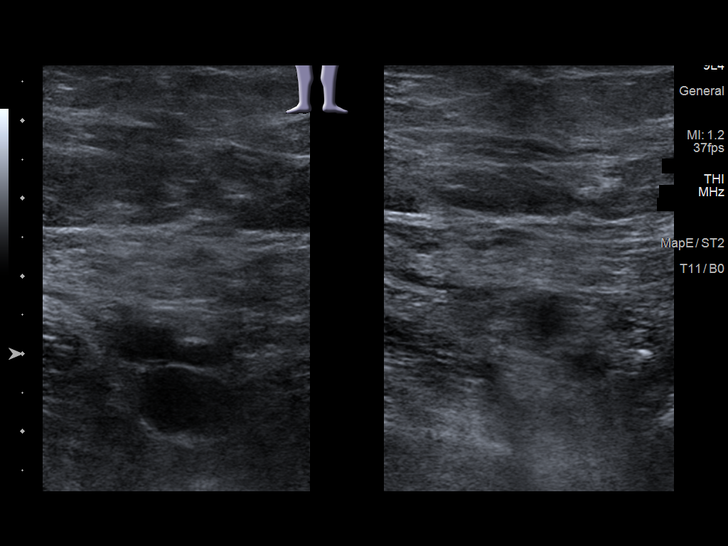
[im 22/36]
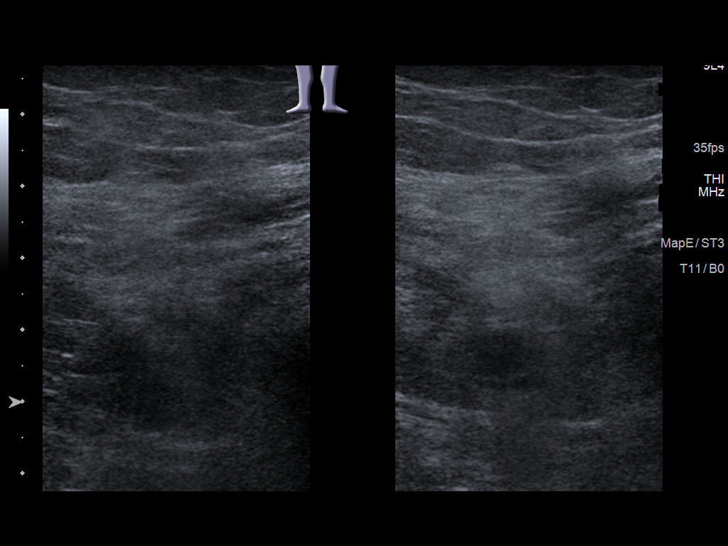
[im 25/36]
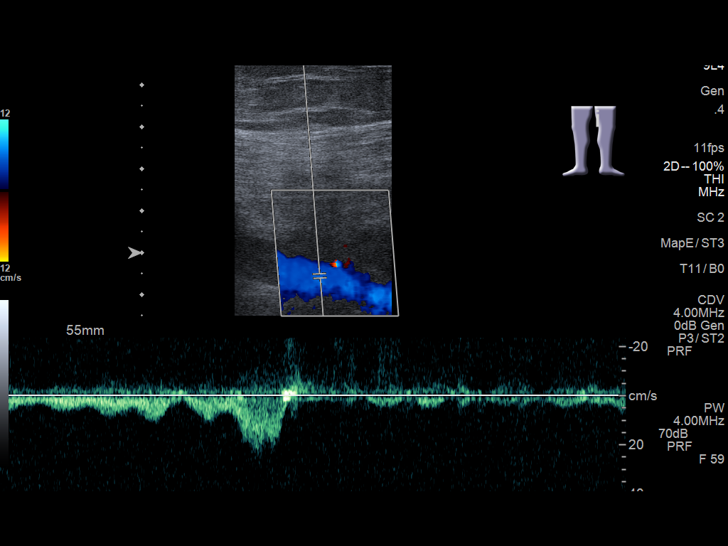
[im 28/36]
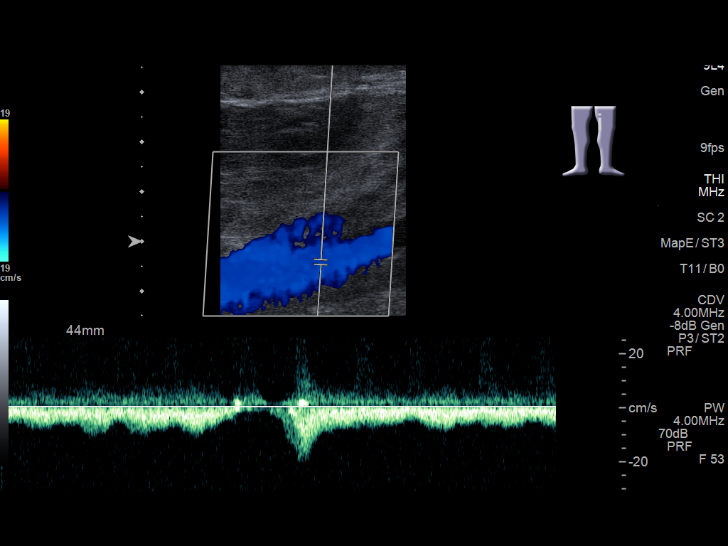
[im 29/36]
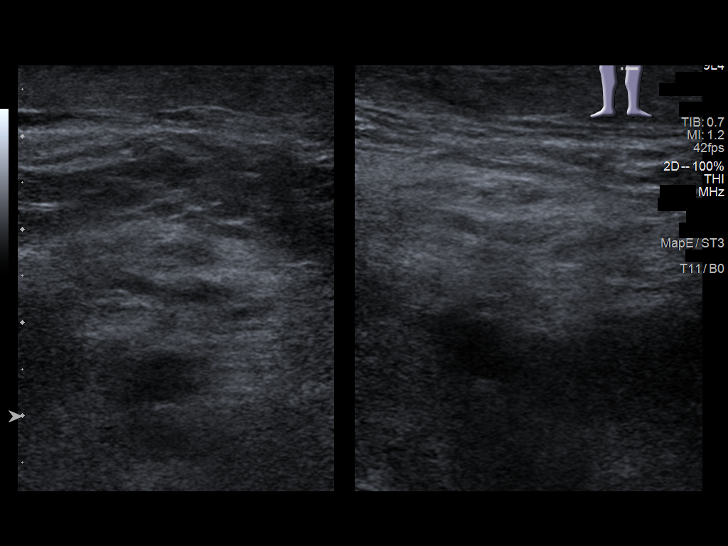
[im 32/36]
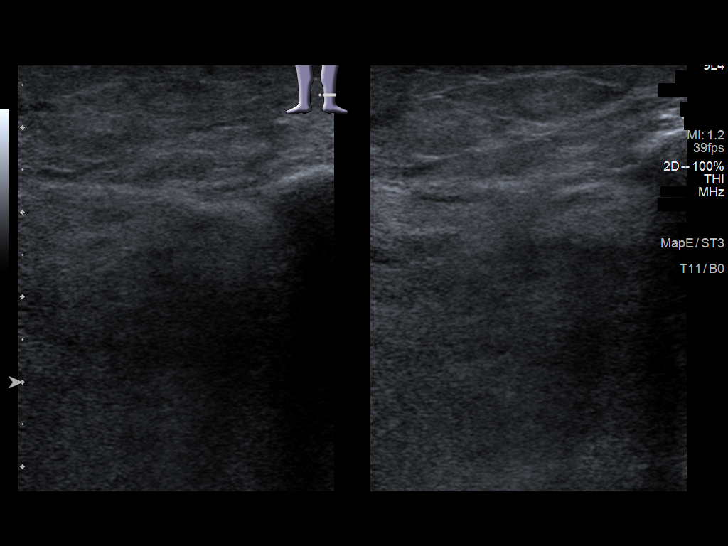
[im 36/36]
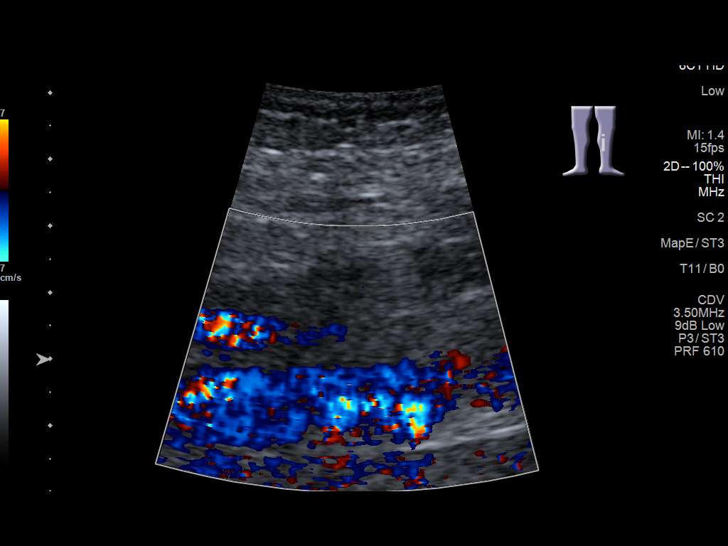

[14 of 24 positions shown; findings below may reference images not displayed]

FINDINGS: Normal compressibility of the common femoral, superficial femoral,
and popliteal veins, as well as the proximal calf veins. No filling
defects to suggest DVT on grayscale or color Doppler imaging.
Doppler waveforms show normal direction of venous flow, normal
respiratory phasicity and response to augmentation.

Visualized segments of the saphenous venous system normal in caliber
and compressibility. Survey views of the contralateral common
femoral vein are unremarkable.
IMPRESSION: No femoropopliteal and no calf DVT in the visualized calf veins. If
clinical symptoms are inconsistent or if there are persistent or
worsening symptoms, further imaging (possibly involving the iliac
veins) may be warranted.
